# Patient Record
Sex: Male | Born: 1935 | Race: White | Hispanic: No | Marital: Married | Smoking: Never smoker
Health system: Southern US, Community
[De-identification: ages and names within clinical notes are randomized; demographics above are authoritative.]

## PROBLEM LIST (undated history)

## (undated) DIAGNOSIS — C801 Malignant (primary) neoplasm, unspecified: Secondary | ICD-10-CM

## (undated) DIAGNOSIS — G510 Bell's palsy: Secondary | ICD-10-CM

## (undated) DIAGNOSIS — D333 Benign neoplasm of cranial nerves: Secondary | ICD-10-CM

## (undated) DIAGNOSIS — R29810 Facial weakness: Secondary | ICD-10-CM

## (undated) DIAGNOSIS — D3610 Benign neoplasm of peripheral nerves and autonomic nervous system, unspecified: Secondary | ICD-10-CM

## (undated) DIAGNOSIS — R413 Other amnesia: Secondary | ICD-10-CM

## (undated) HISTORY — PX: BRAIN SURGERY: SHX531

---

## 2002-12-13 ENCOUNTER — Emergency Department (HOSPITAL_COMMUNITY): Admission: EM | Admit: 2002-12-13 | Discharge: 2002-12-13 | Payer: Self-pay | Admitting: *Deleted

## 2002-12-13 ENCOUNTER — Emergency Department (HOSPITAL_COMMUNITY): Admission: EM | Admit: 2002-12-13 | Discharge: 2002-12-13 | Payer: Self-pay | Admitting: Emergency Medicine

## 2003-08-18 ENCOUNTER — Emergency Department (HOSPITAL_COMMUNITY): Admission: EM | Admit: 2003-08-18 | Discharge: 2003-08-18 | Payer: Self-pay | Admitting: Emergency Medicine

## 2003-10-16 ENCOUNTER — Emergency Department (HOSPITAL_COMMUNITY): Admission: EM | Admit: 2003-10-16 | Discharge: 2003-10-16 | Payer: Self-pay | Admitting: Emergency Medicine

## 2004-03-29 ENCOUNTER — Emergency Department (HOSPITAL_COMMUNITY): Admission: EM | Admit: 2004-03-29 | Discharge: 2004-03-29 | Payer: Self-pay | Admitting: Emergency Medicine

## 2004-12-23 ENCOUNTER — Ambulatory Visit (HOSPITAL_COMMUNITY): Admission: RE | Admit: 2004-12-23 | Discharge: 2004-12-23 | Payer: Self-pay | Admitting: General Surgery

## 2005-01-06 ENCOUNTER — Encounter (INDEPENDENT_AMBULATORY_CARE_PROVIDER_SITE_OTHER): Payer: Self-pay | Admitting: General Surgery

## 2005-01-07 ENCOUNTER — Inpatient Hospital Stay (HOSPITAL_COMMUNITY): Admission: RE | Admit: 2005-01-07 | Discharge: 2005-01-09 | Payer: Self-pay | Admitting: General Surgery

## 2005-04-30 ENCOUNTER — Emergency Department (HOSPITAL_COMMUNITY): Admission: EM | Admit: 2005-04-30 | Discharge: 2005-04-30 | Payer: Self-pay | Admitting: Emergency Medicine

## 2006-01-04 ENCOUNTER — Ambulatory Visit (HOSPITAL_COMMUNITY): Admission: RE | Admit: 2006-01-04 | Discharge: 2006-01-04 | Payer: Self-pay | Admitting: Family Medicine

## 2006-01-26 ENCOUNTER — Ambulatory Visit (HOSPITAL_COMMUNITY): Admission: RE | Admit: 2006-01-26 | Discharge: 2006-01-26 | Payer: Self-pay | Admitting: Urology

## 2006-02-09 ENCOUNTER — Encounter (INDEPENDENT_AMBULATORY_CARE_PROVIDER_SITE_OTHER): Payer: Self-pay | Admitting: Specialist

## 2006-02-09 ENCOUNTER — Ambulatory Visit (HOSPITAL_COMMUNITY): Admission: RE | Admit: 2006-02-09 | Discharge: 2006-02-09 | Payer: Self-pay | Admitting: Urology

## 2009-02-14 ENCOUNTER — Emergency Department (HOSPITAL_COMMUNITY): Admission: EM | Admit: 2009-02-14 | Discharge: 2009-02-14 | Payer: Self-pay | Admitting: Emergency Medicine

## 2010-07-31 NOTE — Discharge Summary (Signed)
John [name], John [name]               ACCOUNT NO.:  [id number]   MEDICAL RECORD NO.:  [id number]          PATIENT TYPE:  INP   LOCATION:  A313                          FACILITY:  [facility]   PHYSICIAN:  Barbaraann Barthel, M.D. DATE OF BIRTH:  May 26, 1927   DATE OF ADMISSION:  01/06/2005  DATE OF DISCHARGE:  10/28/2006LH                                 DISCHARGE SUMMARY   DIAGNOSIS:  Acute cholecystitis secondary to cholelithiasis.   PROCEDURE:  On January 06, 2005 laparoscopic cholecystectomy.   SECONDARY DIAGNOSIS:  Seizure disorder of unknown cause.   HISTORY:  Note this is a 77 year old white male who had recurrent episodes  of right upper quadrant pain and occasional dyspepsia. He did not pay any  particular attention to this. He did however bring it to the attention of  his medical physician and sonogram was obtained and revealed cholelithiasis  and there did not appear to be any changes suggesting acute cholecystitis  however this was not verified intraoperatively. He indeed had a quite  thickened gallbladder and hydrops of the gallbladder necessitating his more  prolonged stay within the hospital.   He underwent surgery on January 06, 2005 and a laparoscopic cholecystectomy  was performed. He did well postoperatively, he did have some drainage from  his Jackson-Pratt drain, this was removed on the third postoperative day at  time of his discharge, his liver function studies and white count remained  within acceptable levels. Postoperatively the patient's Jackson-Pratt drain  diminished;  his pain was controlled with parenteral medicine during his  hospital stay adequately. He remained on antibiotics due to the findings of  acute cholecystitis. On the third postoperative day he was discharged. At  time of discharge he was tolerating p.o. well, he was voiding without  dysuria, his Jackson-Pratt drain had diminished and all of his wounds were  clean without any sign of infection. He was  not troubled with any seizure  problems. He will resume his aspirin which is the only medicine that he  apparently takes for his seizure and this is an unknown effect for which  showed Dr. Sandria Manly is following him. At any rate he did well postoperatively;  he was discharged on the third postoperative day.   LABORATORY DATA:  On January 07, 2005, his white count was 8.6 with a H&H of  13.1 and 37.7. His metabolic seven was grossly within normal limits. His  liver function studies also were grossly within normal limits. Total  bilirubin 1.7 with direct 0.2 and an indirect 1.5 (these showed a further  downward trend on the following day on January 08, 2005). His sonogram as  noted showed cholecystitis with cholelithiasis. His pathology report is not  on the chart at the time of this dictation.   DISCHARGE INSTRUCTIONS:  He is excused from work. He is discharged on a full  liquid and soft diet. He is permitted to increase his activity as tolerated.  He is permitted to shower. He is told not to go in a Jacuzzi or pool or tub  bath at present, no driving, no heavy lifting, no sexual  activity. His drain  site is to be changed as needed and this was explained to the patient. He is  to resume his medicines as per Dr. Sudie Bailey postoperatively. He is resume  his aspirin. He was further given a prescription for Darvocet-N 100 one  tablet every 4 hours as needed for pain. He is  told to contact me should there be any acute changes and I will follow him  perioperatively in my office and arrangements were made for this. He is told  to follow up further with Dr. Sudie Bailey for any medical problems he may have  in the future as well.      Barbaraann Barthel, M.D.  Electronically Signed     WB/MEDQ  D:  01/09/2005  T:  01/09/2005  Job:  [id number]   cc:   Mila Homer. Sudie Bailey, M.D.  Fax: [phone]   Genene Churn. Love, M.D.  Fax: [phone]

## 2010-07-31 NOTE — H&P (Signed)
[name], [name]               ACCOUNT NO.:  [id number]   MEDICAL RECORD NO.:  [id number]          PATIENT TYPE:  AMB   LOCATION:  DAY                           FACILITY:  [facility]   PHYSICIAN:  Dennie Maizes, M.D.   DATE OF BIRTH:  [birth year]   DATE OF ADMISSION:  02/09/2006  DATE OF DISCHARGE:  LH                              HISTORY & PHYSICAL   CHIEF COMPLAINT:  Intermittent gross hematuria, large bladder calculus.   HISTORY OF PRESENT ILLNESS:  This 77 year old male was referred to me by  Dr. Sudie Bailey with a past history of recurrent urolithiasis.  He has  passed several stones.  He has been having intermittent episodes of  gross hematuria since May of 2007.  He has had several episodes of  hematuria.  He did not have any voiding difficulty, dysuria, fever,  chills or frank pain.  He has urinary frequency q.2-3h. and nocturia x1.  His past x-rays have revealed  a large bladder calculus with an enlarged  prostate.   PAST MEDICAL HISTORY:  1. Status post excision of right acoustic neuroma in 1995.  2. Status post cholecystectomy.  3. History of recurrent urolithiasis.   MEDICATIONS:  Aspirin, which has been discontinued for the surgery.   ALLERGIES:  None.   FAMILY HISTORY:  Unremarkable.   PHYSICAL EXAMINATION:  HEENT:  The patient has right facial paralysis;  otherwise normal.  NECK:  No masses.  LUNGS:  Clear to auscultation.  HEART:  Regular rate and rhythm.  No murmurs.  ABDOMEN:  Soft, not palpable.  No frank mass, CVA tenderness or bladder  distention.  GU:  Penis and testes are normal.  RECTAL:  A 50 gm benign prostate.   IMPRESSION:  Hematuria, bladder calculus, benign prostatic hypertrophy  with bladder neck obstruction.   PLAN:  1. Cystoscopy, Holmium laser lithotripsy of the bladder calculus under      anesthesia in the short-stay center.  I have discussed with the      patient regarding the diagnosis, operative details, alternate      treatment,  possible risks and complications, and he has agreed for      the procedure to be done.      Dennie Maizes, M.D.  Electronically Signed     SK/MEDQ  D:  02/09/2006  T:  02/09/2006  Job:  [id number]   cc:   Mila Homer. Sudie Bailey, M.D.  Fax: [phone]

## 2010-07-31 NOTE — Op Note (Signed)
NAMECADELL, GABRIELSON               ACCOUNT NO.:  [id number]   MEDICAL RECORD NO.:  [id number]          PATIENT TYPE:  OBV   LOCATION:  A313                          FACILITY:  [facility]   PHYSICIAN:  Barbaraann Barthel, M.D. DATE OF BIRTH:  07-09-1927   DATE OF PROCEDURE:  DATE OF DISCHARGE:                                 OPERATIVE REPORT   SURGEON:  Barbaraann Barthel, M.D.   PRE-AND-POSTOPERATIVE DIAGNOSIS:  Acute cholecystitis.   PROCEDURE:  Laparoscopic cholecystectomy.   SPECIMEN:  Gallbladder with stones.   GROSS OPERATIVE FINDINGS:  The patient had considerable inflammation of the  gallbladder with adhesions and hydrops of the gallbladder. We aspirated  approximately 30 mL of clear bile from the gallbladder. There was a large  stone within the neck of the gallbladder.   The rest of the right upper quadrant appeared to be within normal limits.   NOTE:  This is a 77 year old white male who presented with history of right  upper quadrant pain, nausea, and vomiting. He essentially attributed this  pain to dyspepsia. He was taking a lot of Rolaids over the years. Sonogram  revealed a gallbladder with a stone in it; and did not reveal symptoms or  signs of acute cholecystitis; however, these we encountered  intraoperatively, i.e., thickening and edema and hydrops of the gallbladder.   TECHNIQUE:  The patient was placed in the supine position after the adequate  administration of general anesthesia via endotracheal intubation.  The  patient was prepped with another solution other than Betadine, as the  patient expressed an allergy to iodine.  A Foley catheter was then  aseptically inserted and incisions were made down to the superior aspect of  the umbilicus.  The fascia was grasped with a sharp towel clip, and  elevated.  A Veress needle was inserted and confirmed in position with a  saline drop test.  We then insufflated the abdomen of approximately 3.5  liters of CO2. Then using  the Vis-A-Port technique, an 11-mm cannula was  placed in the umbilicus incision; and then under direct vision three other  cannulas were placed; an 11-mm cannula in the epigastrium, and two 5-mm  cannulas in the right upper quadrant laterally.   The gallbladder was grasped, its adhesions were tediously taken down.  The  cystic duct was clearly visualized, triply, silver clipped on the side of  the common bile duct and then singly silver clipped on the side of the  gallbladder.  The cystic artery was likewise triply silver clipped and  divided.  The gallbladder was then removed from the liver bed. This was  somewhat tedious as the gallbladder was pretty intrahepatic.  . This was  done with the hook cautery device and then the gallbladder was removed using  the EndoCatch device from the epigastric incision.   We checked for hemostasis. The oozing on the gallbladder was cauterized with  a cautery device.  I elected to leave two pieces of Surgicel within the  liver bed; and place a Jackson-Pratt drain. It should be stated that prior  to removing the gallbladder, prior to  initiating, we had to remove  approximately 30 mL of fluid from the gallbladder by needle aspiration, but  this was uneventful. The drain was exited from one of the lateral 5-mm  cannula sites and sutured in place with a 3-0 Nylon suture. The fascia was  closed in the area of the umbilicus and epigastrium with #0 Polysorb.  All  incision sites were infiltrated with 1/2% Sensorcaine without epinephrine;  and the wounds were closed with a stapling device. Prior to closure all  sponge, needle, and instrument counts were found to be correct. Estimated  blood loss was minimal. The patient received 1400 mL of crystalloids  intraoperatively; and there were no complications.      Barbaraann Barthel, M.D.  Electronically Signed     WB/MEDQ  D:  01/06/2005  T:  01/06/2005  Job:  [id number]

## 2010-07-31 NOTE — Op Note (Signed)
NAMEMONTAVIOUS, John [name]               ACCOUNT NO.:  [id number]   MEDICAL RECORD NO.:  [id number]          PATIENT TYPE:  AMB   LOCATION:  DAY                           FACILITY:  [facility]   PHYSICIAN:  Dennie Maizes, M.D.   DATE OF BIRTH:  08-Jul-1927   DATE OF PROCEDURE:  02/09/2006  DATE OF DISCHARGE:                               OPERATIVE REPORT   PREOPERATIVE DIAGNOSIS:  Hematuria, large bladder calculus (1.5 cm).   POSTOPERATIVE DIAGNOSIS:  Hematuria, large bladder calculus (1.5 cm).   OPERATIVE PROCEDURE:  Cystoscopy, holmium laser lithotripsy of bladder  calculus (1.5 cm).   ANESTHESIA:  General.   SURGEON:  Dennie Maizes, M.D.   COMPLICATIONS:  None.   ESTIMATED BLOOD LOSS:  Minimal.   DRAINS:  18-French Foley catheter in the bladder.   INDICATIONS FOR PROCEDURE:  This 78 year old male has a past history of  recurrent urolithiasis.  He was evaluated for intermittent gross  hematuria and he was found to have a large bladder calculus measuring  1.5 cm in size.  The patient was taken to the operating room today for  cystoscopy and holmium laser lithotripsy of the bladder calculus.   DESCRIPTION OF PROCEDURE:  General anesthesia was induced and the  patient was placed on the OR table in the dorsal lithotomy position.  The lower abdomen and genitalia were prepped and draped in a sterile  fashion.  Cystoscopy was done with a 25-French scope.  The urethra was  normal.  There was moderate hypertrophy of the prostate with bladder  neck obstruction.  The bladder was then examined and found to be normal.  There was large calculus 1.5 cm in size inside the bladder lumen.   A 5 50 micron laser fiber was then inserted through the cystoscope.  The  stone was treated holmium laser and fragmented.  The laser settings  laser fiber 5 50 microns, laser energy 1.6 joules, rate pulse 20 total  KJ  3.95.   The stone was fragmented to smaller pieces by laser energy.  Irrigation  of bladder  was done with an Ellik evacuator and all the stone fragments  were removed from the bladder.  There was mild bleeding from the  prostate which resolved.  The instruments were then removed.  They a 38-  [place] Foley catheter was inserted into the bladder and connected to the  bag.  The patient was transferred to the PACU in a satisfactory  condition.      Dennie Maizes, M.D.  Electronically Signed     SK/MEDQ  D:  02/09/2006  T:  02/09/2006  Job:  [id number]   cc:   Mila Homer. Sudie Bailey, M.D.  Fax: [phone]

## 2011-02-17 ENCOUNTER — Observation Stay (HOSPITAL_COMMUNITY)
Admission: EM | Admit: 2011-02-17 | Discharge: 2011-02-18 | Disposition: A | Discharge status: Home / Self Care | Payer: Medicare Other | Attending: Family Medicine | Admitting: Family Medicine

## 2011-02-17 ENCOUNTER — Encounter: Payer: Self-pay | Admitting: *Deleted

## 2011-02-17 DIAGNOSIS — I951 Orthostatic hypotension: Secondary | ICD-10-CM | POA: Diagnosis present

## 2011-02-17 DIAGNOSIS — Z86018 Personal history of other benign neoplasm: Secondary | ICD-10-CM

## 2011-02-17 DIAGNOSIS — R29810 Facial weakness: Secondary | ICD-10-CM | POA: Diagnosis present

## 2011-02-17 DIAGNOSIS — R011 Cardiac murmur, unspecified: Secondary | ICD-10-CM | POA: Insufficient documentation

## 2011-02-17 DIAGNOSIS — E860 Dehydration: Secondary | ICD-10-CM | POA: Diagnosis present

## 2011-02-17 DIAGNOSIS — Z79899 Other long term (current) drug therapy: Secondary | ICD-10-CM | POA: Insufficient documentation

## 2011-02-17 DIAGNOSIS — R55 Syncope and collapse: Principal | ICD-10-CM | POA: Insufficient documentation

## 2011-02-17 DIAGNOSIS — I69992 Facial weakness following unspecified cerebrovascular disease: Secondary | ICD-10-CM | POA: Insufficient documentation

## 2011-02-17 DIAGNOSIS — R911 Solitary pulmonary nodule: Secondary | ICD-10-CM | POA: Diagnosis present

## 2011-02-17 DIAGNOSIS — R42 Dizziness and giddiness: Secondary | ICD-10-CM | POA: Diagnosis present

## 2011-02-17 HISTORY — DX: Bell's palsy: G51.0

## 2011-02-17 HISTORY — DX: Benign neoplasm of peripheral nerves and autonomic nervous system, unspecified: D36.10

## 2011-02-17 MED ORDER — ONDANSETRON HCL 4 MG/2ML IJ SOLN
4.0000 mg | Freq: Once | INTRAMUSCULAR | Status: AC
  Administered 2011-02-18: 4 mg via INTRAVENOUS
  Filled 2011-02-17: qty 2

## 2011-02-17 MED ORDER — SODIUM CHLORIDE 0.9 % IV SOLN
INTRAVENOUS | Status: DC
  Administered 2011-02-18: via INTRAVENOUS

## 2011-02-17 NOTE — ED Notes (Signed)
Pt reports nausea and weakness began about 9 pm.  Denies any vomiting at this time.  Reports mild temperature.

## 2011-02-18 ENCOUNTER — Emergency Department (HOSPITAL_COMMUNITY): Payer: Medicare Other

## 2011-02-18 ENCOUNTER — Other Ambulatory Visit: Payer: Self-pay

## 2011-02-18 ENCOUNTER — Encounter (HOSPITAL_COMMUNITY): Payer: Self-pay | Admitting: *Deleted

## 2011-02-18 ENCOUNTER — Inpatient Hospital Stay (HOSPITAL_COMMUNITY): Payer: Medicare Other

## 2011-02-18 DIAGNOSIS — R42 Dizziness and giddiness: Secondary | ICD-10-CM | POA: Diagnosis present

## 2011-02-18 DIAGNOSIS — R29810 Facial weakness: Secondary | ICD-10-CM | POA: Diagnosis present

## 2011-02-18 DIAGNOSIS — Z86018 Personal history of other benign neoplasm: Secondary | ICD-10-CM

## 2011-02-18 DIAGNOSIS — I359 Nonrheumatic aortic valve disorder, unspecified: Secondary | ICD-10-CM

## 2011-02-18 DIAGNOSIS — E860 Dehydration: Secondary | ICD-10-CM | POA: Diagnosis present

## 2011-02-18 DIAGNOSIS — R911 Solitary pulmonary nodule: Secondary | ICD-10-CM | POA: Diagnosis present

## 2011-02-18 DIAGNOSIS — I951 Orthostatic hypotension: Secondary | ICD-10-CM | POA: Diagnosis present

## 2011-02-18 LAB — COMPREHENSIVE METABOLIC PANEL
ALT: 13 U/L (ref 0–53)
AST: 16 U/L (ref 0–37)
Albumin: 3.7 g/dL (ref 3.5–5.2)
Alkaline Phosphatase: 59 U/L (ref 39–117)
BUN: 14 mg/dL (ref 6–23)
CO2: 26 mEq/L (ref 19–32)
Calcium: 9.9 mg/dL (ref 8.4–10.5)
Chloride: 103 mEq/L (ref 96–112)
Creatinine, Ser: 1.26 mg/dL (ref 0.50–1.35)
GFR calc Af Amer: 60 mL/min — ABNORMAL LOW (ref >90)
GFR calc non Af Amer: 52 mL/min — ABNORMAL LOW (ref >90)
Glucose, Bld: 150 mg/dL — ABNORMAL HIGH (ref 70–99)
Potassium: 3.6 mEq/L (ref 3.5–5.1)
Sodium: 137 mEq/L (ref 135–145)
Total Bilirubin: 2.9 mg/dL — ABNORMAL HIGH (ref 0.3–1.2)
Total Protein: 6.6 g/dL (ref 6.0–8.3)

## 2011-02-18 LAB — URINALYSIS, ROUTINE W REFLEX MICROSCOPIC
Glucose, UA: NEGATIVE mg/dL
Ketones, ur: 40 mg/dL — AB
Leukocytes, UA: NEGATIVE
Nitrite: NEGATIVE
Specific Gravity, Urine: 1.01 (ref 1.005–1.030)
Urobilinogen, UA: 1 mg/dL (ref 0.0–1.0)
pH: 6.5 (ref 5.0–8.0)

## 2011-02-18 LAB — CBC
HCT: 42.3 % (ref 39.0–52.0)
HCT: 42.4 % (ref 39.0–52.0)
Hemoglobin: 14.7 g/dL (ref 13.0–17.0)
MCH: 32.1 pg (ref 26.0–34.0)
MCH: 32.5 pg (ref 26.0–34.0)
MCHC: 34.7 g/dL (ref 30.0–36.0)
MCV: 92.6 fL (ref 78.0–100.0)
MCV: 93.6 fL (ref 78.0–100.0)
Platelets: 115 10*3/uL — ABNORMAL LOW (ref 150–400)
Platelets: 137 10*3/uL — ABNORMAL LOW (ref 150–400)
RBC: 4.52 MIL/uL (ref 4.22–5.81)
RBC: 4.58 MIL/uL (ref 4.22–5.81)
RDW: 12.5 % (ref 11.5–15.5)
RDW: 12.8 % (ref 11.5–15.5)
WBC: 11.4 10*3/uL — ABNORMAL HIGH (ref 4.0–10.5)

## 2011-02-18 LAB — URINE MICROSCOPIC-ADD ON

## 2011-02-18 LAB — DIFFERENTIAL
Basophils Absolute: 0 10*3/uL (ref 0.0–0.1)
Basophils Relative: 0 % (ref 0–1)
Eosinophils Absolute: 0 10*3/uL (ref 0.0–0.7)
Eosinophils Relative: 0 % (ref 0–5)
Lymphocytes Relative: 6 % — ABNORMAL LOW (ref 12–46)
Lymphs Abs: 0.7 10*3/uL (ref 0.7–4.0)
Monocytes Absolute: 0.6 10*3/uL (ref 0.1–1.0)
Monocytes Relative: 6 % (ref 3–12)
Neutro Abs: 10 10*3/uL — ABNORMAL HIGH (ref 1.7–7.7)
Neutrophils Relative %: 88 % — ABNORMAL HIGH (ref 43–77)

## 2011-02-18 LAB — BASIC METABOLIC PANEL
CO2: 28 mEq/L (ref 19–32)
Calcium: 9.6 mg/dL (ref 8.4–10.5)
Chloride: 102 mEq/L (ref 96–112)
Creatinine, Ser: 1.2 mg/dL (ref 0.50–1.35)
Glucose, Bld: 139 mg/dL — ABNORMAL HIGH (ref 70–99)

## 2011-02-18 LAB — CARDIAC PANEL(CRET KIN+CKTOT+MB+TROPI)
CK, MB: 3.3 ng/mL (ref 0.3–4.0)
Relative Index: 3.1 — ABNORMAL HIGH (ref 0.0–2.5)
Total CK: 108 U/L (ref 7–232)
Total CK: 104 U/L (ref 7–232)
Troponin I: <0.3 ng/mL (ref <0.30)

## 2011-02-18 LAB — LIPASE, BLOOD: Lipase: 25 U/L (ref 11–59)

## 2011-02-18 MED ORDER — SODIUM CHLORIDE 0.9 % IV SOLN
INTRAVENOUS | Status: DC
  Administered 2011-02-18: 04:00:00 via INTRAVENOUS

## 2011-02-18 MED ORDER — IOHEXOL 300 MG/ML  SOLN
80.0000 mL | Freq: Once | INTRAMUSCULAR | Status: AC | PRN
  Administered 2011-02-18: 80 mL via INTRAVENOUS

## 2011-02-18 MED ORDER — ASPIRIN 325 MG PO TABS
325.0000 mg | ORAL_TABLET | Freq: Every day | ORAL | Status: DC
  Administered 2011-02-18: 325 mg via ORAL
  Filled 2011-02-18: qty 1

## 2011-02-18 MED ORDER — SODIUM CHLORIDE 0.9 % IV BOLUS (SEPSIS): 1000.0000 mL | Freq: Once | INTRAVENOUS | Status: DC

## 2011-02-18 MED ORDER — ENOXAPARIN SODIUM 40 MG/0.4ML ~~LOC~~ SOLN
40.0000 mg | SUBCUTANEOUS | Status: DC
  Administered 2011-02-18: 40 mg via SUBCUTANEOUS
  Filled 2011-02-18: qty 0.4

## 2011-02-18 NOTE — ED Notes (Signed)
Hospitalist at bedside 
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
## 2011-02-18 NOTE — Discharge Summary (Signed)
NAMETREVELLE, MCGURN               ACCOUNT NO.:  [id number]  MEDICAL RECORD NO.:  [id number]  LOCATION:  A338                          FACILITY:  [facility]  PHYSICIAN:  Camp Gopal. Sudie Bailey, M.D.DATE OF BIRTH:  [birth year]  DATE OF ADMISSION:  02/17/2011 DATE OF DISCHARGE:  LH                              DISCHARGE SUMMARY   ADDENDUM: He has had a cough recently which he usually does not have.  He has had no fever or chills, and he is not coughing up any sputum, but he has been extremely fatigued.  I talked to the patient and his wife.  I am starting him on Levaquin 500 mg daily which he already has at home, and once he is out of those 3 or 4 pills to take another 10-day course which is written by prescription today for a total of 14 days.  We will recheck a chest x-ray in 2 weeks, and I will see him in follow up shortly after that.  If he does indeed need a biopsy, we should know by then.     Mila Homer. Sudie Bailey, M.D.     SDK/MEDQ  D:  02/18/2011  T:  02/18/2011  Job:  [id number]

## 2011-02-18 NOTE — Progress Notes (Signed)
*  PRELIMINARY RESULTS* Echocardiogram 2D Echocardiogram has been performed.  John [name] 02/18/2011, 12:37 PM

## 2011-02-18 NOTE — H&P (Signed)
PCP:   Dr. Sudie Bailey   Chief Complaint:  Dizziness  HPI: 77 year old male with a history of acoustic neuroma resection on the right in the 1980s with residual right-sided facial droop who comes in with a brief episode of dizziness that occurred while he was eating out at a restaurant. The episode of brief was brief and quickly resolved after sitting down. 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 He denies any recent nausea vomiting diarrhea fevers or chills. He has been in his normal state of health. On arrival in the emergency department he was found to be orthostatic. He denies having any chest pain headache abdominal pain or palpitations.  Review of Systems:  Otherwise negative  Past Medical History: Past Medical History  Diagnosis Date  . Bell's palsy   . Neuroma    Past Surgical History  Procedure Date  . Brain surgery     Medications: Prior to Admission medications   Medication Sig Start Date End Date Taking? Authorizing Provider  aspirin 325 MG tablet Take 325 mg by mouth daily.     Yes Historical Provider, MD    Allergies:  No Known Allergies  Social History:  reports that he has never smoked. He does not have any smokeless tobacco history on file. He reports that he does not drink alcohol. His drug history not on file.  Family History: History reviewed. No pertinent family history.  Physical Exam: Filed Vitals:   02/17/11 2254 02/18/11 0018 02/18/11 0022 02/18/11 0024  BP: 108/71 101/59 97/61 88/55   Pulse: 81 91 91 89  Temp: 98.3 F (36.8 C)     Resp: 16     Height: 6\' 11"  (2.108 m)     Weight: 54.432 kg (120 lb)     SpO2: 93%      General appearance: alert, cooperative and no distress Resp: clear to auscultation bilaterally Cardio: regular rate and rhythm, S1, S2 normal, grade 3 of 6 systolic ejection murmur, click, rub or gallop GI:  soft, non-tender; bowel sounds normal; no masses,  no organomegaly Extremities: extremities normal, atraumatic, no cyanosis or edema Pulses: 2+ and symmetric Skin: Skin color, texture, turgor normal. No rashes or lesions Neurologic: normal except right facial droop signicant and chronic                                                          Labs on Admission:   [facility] [place] 02/17/11 2356  NA 137  K 3.6  CL 103  CO2 26  GLUCOSE 150*  BUN 14  CREATININE 1.26  CALCIUM 9.9  MG --  PHOS --    Basename 02/17/11 2356  AST 16  ALT 13  ALKPHOS 59  BILITOT 2.9*  PROT 6.6  ALBUMIN 3.7    Basename 02/17/11 2356  LIPASE 25  AMYLASE --    Basename 02/17/11 2356  WBC 11.4*  NEUTROABS 10.0*  HGB 14.7  HCT 42.4  MCV 92.6  PLT 115*    Basename 02/17/11 2356  CKTOTAL 108  CKMB 3.3  CKMBINDEX --  TROPONINI <0.30    Radiological Exams on Admission: Dg Chest 2 View  02/18/2011  *RADIOLOGY REPORT*  Clinical  Data: Syncopal episode.  CHEST - 2 VIEW  Comparison: Chest radiographs 10/16/2003.  Findings: The patient is rotated on the lateral view.  Heart size and mediastinal contours are stable. There is a small hiatal hernia.  There is a developing irregular nodular density at the confluence of the left third and sixth ribs on the frontal examination.  This is not well seen on the lateral view but does not appear osseous.  The lungs are otherwise clear.  There is no pleural effusion.  Thoracic spine osteophytes are noted.  IMPRESSION:  1.  Suspicion of irregular left upper lobe pulmonary nodule concerning for bronchogenic carcinoma. 2.  No acute cardiopulmonary process.  Chest CT is recommended for further evaluation.  Original Report Authenticated By: Gerrianne Scale, M.D.   Ct Head Wo Contrast  02/18/2011  *RADIOLOGY REPORT*  Clinical Data: Status post fall; syncope.  History of removal of right-sided acoustic neuroma.  CT HEAD WITHOUT CONTRAST  Technique:   Contiguous axial images were obtained from the base of the skull through the vertex without contrast.  Comparison: None.  Findings: There is no evidence of acute infarction, mass lesion, or intra- or extra-axial hemorrhage on CT.  Prominence of the ventricles and sulci reflects mild cortical volume loss.  Cerebellar atrophy is noted.  Chronic encephalomalacia is noted at the right cerebellar hemisphere, reflecting prior surgery.  The brainstem and fourth ventricle are within normal limits.  The basal ganglia are unremarkable in appearance.  The cerebral hemispheres demonstrate grossly normal gray-white differentiation. No mass effect or midline shift is seen.  There is no evidence of fracture; the patient's right occiput craniectomy and right temporal craniotomy are grossly unremarkable in appearance.  The orbits are within normal limits.  The paranasal sinuses and mastoid air cells are well-aerated. There is mild sclerosis of the left mastoid process.  No significant soft tissue abnormalities are seen.  IMPRESSION:  1.  No acute intracranial pathology seen on CT. 2.  Mild cortical volume loss. 3.  Chronic encephalomalacia at the right cerebellar hemisphere, reflecting prior surgery.  Original Report Authenticated By: Tonia Ghent, M.D.    Assessment/Plan Present on Admission:  77 year old male with presyncope and orthostatic hypotension  .Orthostatic hypotension provide some IV fluids overnight he is dehydrated we'll repeat orthostatics in the morning.  .Dizziness/presyncope we'll obtain 2-D echo due to his mild murmur on exam and will not do any further neurological workup due to lack of neurological symptoms at this time.  .Facial droop chronic right stable  .Dehydration .Pulmonary nodule going to obtain a CT of his chest in the morning to better distinguish this nodule.  Patient is Dr. Kenn File patient I will have him as the attending on file.  Carriann Hesse A 02/18/2011, 2:35 AM

## 2011-02-18 NOTE — ED Provider Notes (Signed)
History     CSN: [id number] Arrival date & time: 02/17/2011 10:50 PM   First MD Initiated Contact with Patient 02/17/11 2321      Chief Complaint  Patient presents with  . Nausea  . Emesis    (Consider location/radiation/quality/duration/timing/severity/associated sxs/prior treatment) HPI Comments: Patient presents with generalized weakness and nausea that began acutely around 9 PM. He began to feel weak and dizzy in the car until he is going to throw up. He needed help to get out of the car and felt like he was going to pass out but did not. This is shortly after going out for dinner. He did not hit his head or lose consciousness. He denies any chest pain, shortness of breath, and vomiting. Denies abdominal pain. States he felt generally weak and lightheaded for most of the day. Denies any fever, pain with urination, trouble moving his bowels. He's had problems with dizziness in the past that he has been told is due to global amnesia.  The history is provided by the patient and the spouse.    Past Medical History  Diagnosis Date  . Bell's palsy   . Neuroma     Past Surgical History  Procedure Date  . Brain surgery     History reviewed. No pertinent family history.  History  Substance Use Topics  . Smoking status: Never Smoker   . Smokeless tobacco: Not on file  . Alcohol Use: No      Review of Systems  Constitutional: Positive for activity change, appetite change and fatigue. Negative for fever.  HENT: Negative for congestion and rhinorrhea.   Respiratory: Negative for cough, chest tightness and shortness of breath.   Cardiovascular: Negative for chest pain.  Gastrointestinal: Positive for nausea and vomiting. Negative for abdominal pain.  Genitourinary: Negative for dysuria and hematuria.  Musculoskeletal: Positive for myalgias and arthralgias. Negative for back pain.  Neurological: Positive for dizziness, weakness and light-headedness. Negative for headaches.     Allergies  Review of patient's allergies indicates no known allergies.  Home Medications   Current Outpatient Rx  Name Route Sig Dispense Refill  . ASPIRIN 325 MG PO TABS Oral Take 325 mg by mouth daily.        BP 88/55  Pulse 89  Temp 98.3 F (36.8 C)  Resp 16  Ht 6\' 11"  (2.108 m)  Wt 120 lb (54.432 kg)  BMI 12.25 kg/m2  SpO2 93%  Physical Exam  Constitutional: He is oriented to person, place, and time. He appears well-developed and well-nourished. No distress.  HENT:  Head: Normocephalic and atraumatic.  Mouth/Throat: Oropharynx is clear and moist. No oropharyngeal exudate.  Eyes:       Chronic right eye changes  Neck: Normal range of motion. Neck supple.  Cardiovascular: Normal rate and regular rhythm.   Murmur heard.      3-6 systolic murmur at the base of the heart  Pulmonary/Chest: Effort normal and breath sounds normal. No respiratory distress.  Abdominal: Soft. There is no tenderness. There is no rebound and no guarding.  Musculoskeletal: Normal range of motion. He exhibits no edema and no tenderness.  Neurological: He is alert and oriented to person, place, and time. No cranial nerve deficit.       Chronic right facial droop  Skin: Skin is warm.    ED Course  Procedures (including critical care time)  Labs Reviewed  CBC - Abnormal; Notable for the following:    WBC 11.4 (*)    Platelets  115 (*)    All other components within normal limits  DIFFERENTIAL - Abnormal; Notable for the following:    Neutrophils Relative 88 (*)    Neutro Abs 10.0 (*)    Lymphocytes Relative 6 (*)    All other components within normal limits  COMPREHENSIVE METABOLIC PANEL - Abnormal; Notable for the following:    Glucose, Bld 150 (*)    Total Bilirubin 2.9 (*)    GFR calc non Af Amer 52 (*)    GFR calc Af Amer 60 (*)    All other components within normal limits  CARDIAC PANEL(CRET KIN+CKTOT+MB+TROPI) - Abnormal; Notable for the following:    Relative Index 3.1 (*)     All other components within normal limits  URINALYSIS, ROUTINE W REFLEX MICROSCOPIC - Abnormal; Notable for the following:    Color, Urine AMBER (*) BIOCHEMICALS MAY BE AFFECTED BY COLOR   Hgb urine dipstick SMALL (*)    Bilirubin Urine SMALL (*)    Ketones, ur 40 (*)    Protein, ur TRACE (*)    All other components within normal limits  URINE MICROSCOPIC-ADD ON - Abnormal; Notable for the following:    Bacteria, UA FEW (*)    All other components within normal limits  LIPASE, BLOOD   Dg Chest 2 View  02/18/2011  *RADIOLOGY REPORT*  Clinical Data: Syncopal episode.  CHEST - 2 VIEW  Comparison: Chest radiographs 10/16/2003.  Findings: The patient is rotated on the lateral view.  Heart size and mediastinal contours are stable. There is a small hiatal hernia.  There is a developing irregular nodular density at the confluence of the left third and sixth ribs on the frontal examination.  This is not well seen on the lateral view but does not appear osseous.  The lungs are otherwise clear.  There is no pleural effusion.  Thoracic spine osteophytes are noted.  IMPRESSION:  1.  Suspicion of irregular left upper lobe pulmonary nodule concerning for bronchogenic carcinoma. 2.  No acute cardiopulmonary process.  Chest CT is recommended for further evaluation.  Original Report Authenticated By: Gerrianne Scale, M.D.   Ct Head Wo Contrast  02/18/2011  *RADIOLOGY REPORT*  Clinical Data: Status post fall; syncope.  History of removal of right-sided acoustic neuroma.  CT HEAD WITHOUT CONTRAST  Technique:  Contiguous axial images were obtained from the base of the skull through the vertex without contrast.  Comparison: None.  Findings: There is no evidence of acute infarction, mass lesion, or intra- or extra-axial hemorrhage on CT.  Prominence of the ventricles and sulci reflects mild cortical volume loss.  Cerebellar atrophy is noted.  Chronic encephalomalacia is noted at the right cerebellar hemisphere,  reflecting prior surgery.  The brainstem and fourth ventricle are within normal limits.  The basal ganglia are unremarkable in appearance.  The cerebral hemispheres demonstrate grossly normal gray-white differentiation. No mass effect or midline shift is seen.  There is no evidence of fracture; the patient's right occiput craniectomy and right temporal craniotomy are grossly unremarkable in appearance.  The orbits are within normal limits.  The paranasal sinuses and mastoid air cells are well-aerated. There is mild sclerosis of the left mastoid process.  No significant soft tissue abnormalities are seen.  IMPRESSION:  1.  No acute intracranial pathology seen on CT. 2.  Mild cortical volume loss. 3.  Chronic encephalomalacia at the right cerebellar hemisphere, reflecting prior surgery.  Original Report Authenticated By: Tonia Ghent, M.D.     1. Near  syncope   2. Lung nodule   3. Orthostatic hypotension       MDM  Weakness, nausea, lightheadedness system with near syncope. Patient feels better at this time is hemodynamically stable. We'll check orthostatic vitals, chest x-ray given his hypoxia and head CT.  Near syncope with orthostatic hypotension and previously undocumented heart murmur. Patient would benefit from admission for IV hydration, echocardiogram and workup of his lung nodule.   Date: 02/18/2011  Rate: 81  Rhythm: normal sinus rhythm  QRS Axis: normal  Intervals: normal  ST/T Wave abnormalities: normal  Conduction Disutrbances:none  Narrative Interpretation:   Old EKG Reviewed: none available          Glynn Octave, MD 02/18/11 [phone]

## 2011-02-18 NOTE — Progress Notes (Signed)
D/c instructions reviewed with patient and wife.  Verbalized understanding.  Pt dc'd to home with wife.  Schonewitz, Candelaria Stagers 02/18/2011

## 2011-02-18 NOTE — Progress Notes (Signed)
UR Chart Review Completed  

## 2012-06-10 ENCOUNTER — Encounter (HOSPITAL_COMMUNITY): Payer: Self-pay | Admitting: *Deleted

## 2012-06-10 ENCOUNTER — Emergency Department (HOSPITAL_COMMUNITY)
Admission: EM | Admit: 2012-06-10 | Discharge: 2012-06-10 | Disposition: A | Discharge status: Home / Self Care | Payer: PRIVATE HEALTH INSURANCE | Attending: Emergency Medicine | Admitting: Emergency Medicine

## 2012-06-10 DIAGNOSIS — R112 Nausea with vomiting, unspecified: Secondary | ICD-10-CM | POA: Insufficient documentation

## 2012-06-10 DIAGNOSIS — N390 Urinary tract infection, site not specified: Secondary | ICD-10-CM | POA: Insufficient documentation

## 2012-06-10 DIAGNOSIS — G510 Bell's palsy: Secondary | ICD-10-CM | POA: Insufficient documentation

## 2012-06-10 DIAGNOSIS — Z8669 Personal history of other diseases of the nervous system and sense organs: Secondary | ICD-10-CM | POA: Insufficient documentation

## 2012-06-10 DIAGNOSIS — Z7982 Long term (current) use of aspirin: Secondary | ICD-10-CM | POA: Insufficient documentation

## 2012-06-10 LAB — CBC WITH DIFFERENTIAL/PLATELET
Basophils Absolute: 0 K/uL (ref 0.0–0.1)
Basophils Relative: 0 % (ref 0–1)
Eosinophils Absolute: 0 K/uL (ref 0.0–0.7)
Eosinophils Relative: 0 % (ref 0–5)
HCT: 46.2 % (ref 39.0–52.0)
Hemoglobin: 16.2 g/dL (ref 13.0–17.0)
Lymphocytes Relative: 9 % — ABNORMAL LOW (ref 12–46)
Lymphs Abs: 0.6 K/uL — ABNORMAL LOW (ref 0.7–4.0)
MCH: 31.8 pg (ref 26.0–34.0)
MCHC: 35.1 g/dL (ref 30.0–36.0)
MCV: 90.8 fL (ref 78.0–100.0)
Monocytes Absolute: 0.2 K/uL (ref 0.1–1.0)
Monocytes Relative: 3 % (ref 3–12)
Neutro Abs: 5.7 K/uL (ref 1.7–7.7)
Neutrophils Relative %: 88 % — ABNORMAL HIGH (ref 43–77)
Platelets: 124 K/uL — ABNORMAL LOW (ref 150–400)
RBC: 5.09 MIL/uL (ref 4.22–5.81)
RDW: 12.9 % (ref 11.5–15.5)
WBC: 6.5 K/uL (ref 4.0–10.5)

## 2012-06-10 LAB — URINALYSIS, ROUTINE W REFLEX MICROSCOPIC
Bilirubin Urine: NEGATIVE
Glucose, UA: NEGATIVE mg/dL
Ketones, ur: 15 mg/dL — AB
Leukocytes, UA: NEGATIVE
Nitrite: NEGATIVE
Protein, ur: NEGATIVE mg/dL
Specific Gravity, Urine: 1.02 (ref 1.005–1.030)
Urobilinogen, UA: 1 mg/dL (ref 0.0–1.0)
pH: 8.5 — ABNORMAL HIGH (ref 5.0–8.0)

## 2012-06-10 LAB — BASIC METABOLIC PANEL WITH GFR
BUN: 18 mg/dL (ref 6–23)
CO2: 23 meq/L (ref 19–32)
Calcium: 10.3 mg/dL (ref 8.4–10.5)
Chloride: 99 meq/L (ref 96–112)
Creatinine, Ser: 1.04 mg/dL (ref 0.50–1.35)
GFR calc Af Amer: 75 mL/min — ABNORMAL LOW
GFR calc non Af Amer: 64 mL/min — ABNORMAL LOW
Glucose, Bld: 139 mg/dL — ABNORMAL HIGH (ref 70–99)
Potassium: 4.5 meq/L (ref 3.5–5.1)
Sodium: 138 meq/L (ref 135–145)

## 2012-06-10 LAB — URINE MICROSCOPIC-ADD ON

## 2012-06-10 MED ORDER — ONDANSETRON HCL 4 MG/2ML IJ SOLN
4.0000 mg | Freq: Once | INTRAMUSCULAR | Status: AC
  Administered 2012-06-10: 4 mg via INTRAVENOUS
  Filled 2012-06-10: qty 2

## 2012-06-10 MED ORDER — ONDANSETRON HCL 8 MG PO TABS: 8.0000 mg | ORAL_TABLET | Freq: Three times a day (TID) | ORAL | Status: DC | PRN

## 2012-06-10 MED ORDER — CEPHALEXIN 500 MG PO CAPS: 500.0000 mg | ORAL_CAPSULE | Freq: Three times a day (TID) | ORAL | Status: DC

## 2012-06-10 MED ORDER — DEXTROSE 5 % IV SOLN
1.0000 g | INTRAVENOUS | Status: DC
  Administered 2012-06-10: 1 g via INTRAVENOUS
  Filled 2012-06-10: qty 10

## 2012-06-10 NOTE — ED Notes (Signed)
Pt presents to er with c/o n/v, pt vomiting in triage, states that he started feeling nauseous this am when he got up, felt like he was going to pass out, denies any loc, denies any pain.

## 2012-06-10 NOTE — ED Provider Notes (Signed)
History  This chart was scribed for John Roots, MD by Erskine Emery, ED Scribe. This patient was seen in room [facility] and the patient's care was started at 13:56.   CSN: [id number]  Arrival date & time 06/10/12  1248   First MD Initiated Contact with Patient 06/10/12 1356     Level 5 Caveat--Mental Status Change  Chief Complaint  Patient presents with  . Nausea  . Emesis    (Consider location/radiation/quality/duration/timing/severity/associated sxs/prior Treatment) Level 5 Caveat--Mental Status Change The history is provided by the patient. No language interpreter was used.  John [name] is a 79 y.o. male who presents to the Emergency Department complaining of sudden onset nausea and emesis. Pt picked up wife after her pedicure appt, and then c/o stomach upset, nausea, felt as if was going to vomit.  On arrival to ed single episode emesis, not bloody or bilious. . Pt reports felt faint w nv, but no syncope/loc.  Pt's wife reports he has hx  transient global amnesia attacks, which have been worked up extensively in past. He denies any pains at this time or any bowel movement today but he doesn't think he is abnormally constipated. Pt has a h/o Bell's palsy, acuostic neuroma, and brain surgery in 1989. Pt's spouse denies being sick herself. No known ill contacts. 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 Denies eye pain or change in vision.      Past Medical History  Diagnosis Date  . Bell's palsy   . Neuroma     Past Surgical History  Procedure Laterality Date  . Brain surgery      No family history on file.  History  Substance Use Topics  . Smoking status: Never Smoker   . Smokeless tobacco: Never Used  . Alcohol Use: No      Review of Systems  Constitutional: Negative for fever.  HENT: Negative for neck pain.   Eyes: Negative for visual disturbance.  Respiratory: Negative for cough.   Cardiovascular: Negative for  chest pain.  Gastrointestinal: Negative for abdominal pain.  Genitourinary: Negative for flank pain.  Musculoskeletal: Negative for back pain.  Skin: Negative for rash.  Neurological: Negative for headaches.  Hematological: Does not bruise/bleed easily.  Psychiatric/Behavioral: The patient is not nervous/anxious.   ?relability ros, spouse noting hx tga  Allergies  Fish allergy  Home Medications   Current Outpatient Rx  Name  Route  Sig  Dispense  Refill  . aspirin 325 MG tablet   Oral   Take 325 mg by mouth daily.             Triage Vitals: BP 137/93  Pulse 64  Temp(Src) 97.3 F (36.3 C) (Oral)  Resp 22  Ht 5\' 9"  (1.753 m)  Wt 150 lb (68.04 kg)  BMI 22.14 kg/m2  SpO2 100%  Physical Exam  Nursing note and vitals reviewed. Constitutional: He is oriented to person, place, and time. He appears well-developed and well-nourished. No distress.  HENT:  Head: Normocephalic and atraumatic.  Mouth/Throat: Oropharynx is clear and moist.  Eyes: Conjunctivae and EOM are normal. Pupils are equal, round, and reactive to light.  Neck: Neck supple. No tracheal deviation present.  Cardiovascular: Normal rate, regular rhythm, normal heart sounds and intact distal pulses.   Pulmonary/Chest: Effort normal and breath sounds normal. No respiratory distress.  Abdominal: Soft. Bowel sounds are normal. He exhibits no distension and no mass. There is no tenderness. There is no  rebound and no guarding.  Musculoskeletal: Normal range of motion. He exhibits no edema.  Neurological: He is alert and oriented to person, place, and time.  Skin: Skin is warm and dry. He is not diaphoretic.  Psychiatric: He has a normal mood and affect.    ED Course  Procedures (including critical care time) DIAGNOSTIC STUDIES: Oxygen Saturation is 100% on room air, normal by my interpretation.    COORDINATION OF CARE: 14:23--I evaluated the patient and we discussed a treatment plan including Zofran, and blood  work to which the pt and his wife agreed.   Results for orders placed during the hospital encounter of 06/10/12  CBC WITH DIFFERENTIAL      Result Value Range   WBC 6.5  4.0 - 10.5 K/uL   RBC 5.09  4.22 - 5.81 MIL/uL   Hemoglobin 16.2  13.0 - 17.0 g/dL   HCT 16.1  09.6 - 04.5 %   MCV 90.8  78.0 - 100.0 fL   MCH 31.8  26.0 - 34.0 pg   MCHC 35.1  30.0 - 36.0 g/dL   RDW 40.9  81.1 - 91.4 %   Platelets 124 (*) 150 - 400 K/uL   Neutrophils Relative 88 (*) 43 - 77 %   Neutro Abs 5.7  1.7 - 7.7 K/uL   Lymphocytes Relative 9 (*) 12 - 46 %   Lymphs Abs 0.6 (*) 0.7 - 4.0 K/uL   Monocytes Relative 3  3 - 12 %   Monocytes Absolute 0.2  0.1 - 1.0 K/uL   Eosinophils Relative 0  0 - 5 %   Eosinophils Absolute 0.0  0.0 - 0.7 K/uL   Basophils Relative 0  0 - 1 %   Basophils Absolute 0.0  0.0 - 0.1 K/uL  BASIC METABOLIC PANEL      Result Value Range   Sodium 138  135 - 145 mEq/L   Potassium 4.5  3.5 - 5.1 mEq/L   Chloride 99  96 - 112 mEq/L   CO2 23  19 - 32 mEq/L   Glucose, Bld 139 (*) 70 - 99 mg/dL   BUN 18  6 - 23 mg/dL   Creatinine, Ser 7.82  0.50 - 1.35 mg/dL   Calcium 95.6  8.4 - 21.3 mg/dL   GFR calc non Af Amer 64 (*) >90 mL/min   GFR calc Af Amer 75 (*) >90 mL/min  URINALYSIS, ROUTINE W REFLEX MICROSCOPIC      Result Value Range   Color, Urine YELLOW  YELLOW   APPearance CLOUDY (*) CLEAR   Specific Gravity, Urine 1.020  1.005 - 1.030   pH 8.5 (*) 5.0 - 8.0   Glucose, UA NEGATIVE  NEGATIVE mg/dL   Hgb urine dipstick TRACE (*) NEGATIVE   Bilirubin Urine NEGATIVE  NEGATIVE   Ketones, ur 15 (*) NEGATIVE mg/dL   Protein, ur NEGATIVE  NEGATIVE mg/dL   Urobilinogen, UA 1.0  0.0 - 1.0 mg/dL   Nitrite NEGATIVE  NEGATIVE   Leukocytes, UA NEGATIVE  NEGATIVE  URINE MICROSCOPIC-ADD ON      Result Value Range   WBC, UA 7-10  <3 WBC/hpf   RBC / HPF 7-10  <3 RBC/hpf   Bacteria, UA MANY (*) RARE        MDM  I personally performed the services described in this documentation, which  was scribed in my presence. The recorded information has been reviewed and is accurate.   Reviewed nursing notes and prior charts for additional history.  Date: 06/10/2012  Rate: 54  Rhythm: sinus bradycardia  QRS Axis: normal  Intervals: normal  ST/T Wave abnormalities: normal  Conduction Disutrbances:none  Narrative Interpretation:   Old EKG Reviewed: changes noted No acute significant change from prior x rate slower  ua w many bacteria, 7-10 wbc, will cx. Rocephin iv.  Recheck tolerating po fluids well. No nv. abd soft nt.   Pt denies any c/o, requests d/c.   John Roots, MD 06/10/12 [phone]

## 2012-06-11 LAB — URINE CULTURE

## 2015-08-18 ENCOUNTER — Inpatient Hospital Stay (HOSPITAL_COMMUNITY)
Admission: EM | Admit: 2015-08-18 | Discharge: 2015-08-21 | DRG: 084 | Disposition: A | Discharge status: Home Health | Payer: Medicare Other | Attending: General Surgery | Admitting: General Surgery

## 2015-08-18 ENCOUNTER — Encounter (HOSPITAL_COMMUNITY): Payer: Self-pay

## 2015-08-18 ENCOUNTER — Emergency Department (HOSPITAL_COMMUNITY): Payer: Medicare Other

## 2015-08-18 DIAGNOSIS — W19XXXA Unspecified fall, initial encounter: Secondary | ICD-10-CM | POA: Diagnosis present

## 2015-08-18 DIAGNOSIS — S42001A Fracture of unspecified part of right clavicle, initial encounter for closed fracture: Secondary | ICD-10-CM | POA: Diagnosis not present

## 2015-08-18 DIAGNOSIS — S066X9A Traumatic subarachnoid hemorrhage with loss of consciousness of unspecified duration, initial encounter: Secondary | ICD-10-CM | POA: Diagnosis not present

## 2015-08-18 DIAGNOSIS — I609 Nontraumatic subarachnoid hemorrhage, unspecified: Secondary | ICD-10-CM | POA: Diagnosis present

## 2015-08-18 DIAGNOSIS — R55 Syncope and collapse: Secondary | ICD-10-CM

## 2015-08-18 DIAGNOSIS — S065XAA Traumatic subdural hemorrhage with loss of consciousness status unknown, initial encounter: Secondary | ICD-10-CM | POA: Diagnosis present

## 2015-08-18 DIAGNOSIS — S065X9A Traumatic subdural hemorrhage with loss of consciousness of unspecified duration, initial encounter: Principal | ICD-10-CM | POA: Diagnosis present

## 2015-08-18 DIAGNOSIS — G510 Bell's palsy: Secondary | ICD-10-CM | POA: Diagnosis not present

## 2015-08-18 DIAGNOSIS — Z7982 Long term (current) use of aspirin: Secondary | ICD-10-CM

## 2015-08-18 DIAGNOSIS — Z91010 Allergy to peanuts: Secondary | ICD-10-CM

## 2015-08-18 DIAGNOSIS — Z91013 Allergy to seafood: Secondary | ICD-10-CM

## 2015-08-18 HISTORY — DX: Facial weakness: R29.810

## 2015-08-18 HISTORY — DX: Malignant (primary) neoplasm, unspecified: C80.1

## 2015-08-18 HISTORY — DX: Other amnesia: R41.3

## 2015-08-18 HISTORY — DX: Benign neoplasm of cranial nerves: D33.3

## 2015-08-18 LAB — CBC WITH DIFFERENTIAL/PLATELET
Basophils Absolute: 0 10*3/uL (ref 0.0–0.1)
Basophils Relative: 0 %
EOS ABS: 0.3 10*3/uL (ref 0.0–0.7)
Eosinophils Relative: 6 %
HCT: 40.2 % (ref 39.0–52.0)
HEMOGLOBIN: 13.3 g/dL (ref 13.0–17.0)
LYMPHS ABS: 0.9 10*3/uL (ref 0.7–4.0)
LYMPHS PCT: 18 %
MCH: 31.2 pg (ref 26.0–34.0)
MCHC: 33.1 g/dL (ref 30.0–36.0)
MCV: 94.4 fL (ref 78.0–100.0)
Monocytes Absolute: 0.2 10*3/uL (ref 0.1–1.0)
Monocytes Relative: 4 %
NEUTROS PCT: 72 %
Neutro Abs: 3.3 10*3/uL (ref 1.7–7.7)
Platelets: 129 10*3/uL — ABNORMAL LOW (ref 150–400)
RBC: 4.26 MIL/uL (ref 4.22–5.81)
RDW: 13.3 % (ref 11.5–15.5)
WBC: 4.7 10*3/uL (ref 4.0–10.5)

## 2015-08-18 LAB — BASIC METABOLIC PANEL
Anion gap: 4 — ABNORMAL LOW (ref 5–15)
BUN: 13 mg/dL (ref 6–20)
CHLORIDE: 108 mmol/L (ref 101–111)
CO2: 27 mmol/L (ref 22–32)
Calcium: 8.9 mg/dL (ref 8.9–10.3)
Creatinine, Ser: 1.14 mg/dL (ref 0.61–1.24)
GFR calc Af Amer: >60 mL/min (ref >60)
GFR calc non Af Amer: 56 mL/min — ABNORMAL LOW (ref >60)
Glucose, Bld: 107 mg/dL — ABNORMAL HIGH (ref 65–99)
POTASSIUM: 3.8 mmol/L (ref 3.5–5.1)
SODIUM: 139 mmol/L (ref 135–145)

## 2015-08-18 LAB — TROPONIN I

## 2015-08-18 LAB — GLUCOSE, CAPILLARY: GLUCOSE-CAPILLARY: 96 mg/dL (ref 65–99)

## 2015-08-18 MED ORDER — ONDANSETRON HCL 4 MG/2ML IJ SOLN: 4.0000 mg | Freq: Three times a day (TID) | INTRAMUSCULAR | Status: DC | PRN

## 2015-08-18 MED ORDER — MORPHINE SULFATE (PF) 2 MG/ML IV SOLN
2.0000 mg | INTRAVENOUS | Status: DC | PRN
Start: 2015-08-18 — End: 2015-08-19
  Administered 2015-08-19: 2 mg via INTRAVENOUS
  Filled 2015-08-18: qty 1

## 2015-08-18 MED ORDER — ACETAMINOPHEN 325 MG PO TABS
650.0000 mg | ORAL_TABLET | ORAL | Status: DC | PRN
  Administered 2015-08-19: 650 mg via ORAL
  Filled 2015-08-18: qty 2

## 2015-08-18 MED ORDER — POTASSIUM CHLORIDE IN NACL 20-0.9 MEQ/L-% IV SOLN
INTRAVENOUS | Status: DC
  Administered 2015-08-19: 50 mL/h via INTRAVENOUS
  Filled 2015-08-18 (×2): qty 1000

## 2015-08-18 MED ORDER — MORPHINE SULFATE (PF) 2 MG/ML IV SOLN
INTRAVENOUS | Status: AC
  Filled 2015-08-18: qty 1

## 2015-08-18 MED ORDER — ONDANSETRON HCL 4 MG/2ML IJ SOLN: 4.0000 mg | Freq: Four times a day (QID) | INTRAMUSCULAR | Status: DC | PRN

## 2015-08-18 MED ORDER — TETANUS-DIPHTH-ACELL PERTUSSIS 5-2.5-18.5 LF-MCG/0.5 IM SUSP
0.5000 mL | Freq: Once | INTRAMUSCULAR | Status: AC
  Administered 2015-08-18: 0.5 mL via INTRAMUSCULAR
  Filled 2015-08-18: qty 0.5

## 2015-08-18 MED ORDER — MORPHINE SULFATE (PF) 2 MG/ML IV SOLN
2.0000 mg | Freq: Once | INTRAVENOUS | Status: AC
Start: 2015-08-18 — End: 2015-08-18
  Administered 2015-08-18: 2 mg via INTRAVENOUS

## 2015-08-18 MED ORDER — TRAMADOL HCL 50 MG PO TABS
50.0000 mg | ORAL_TABLET | Freq: Four times a day (QID) | ORAL | Status: DC | PRN
  Administered 2015-08-19: 50 mg via ORAL
  Filled 2015-08-18: qty 1

## 2015-08-18 MED ORDER — ONDANSETRON HCL 4 MG PO TABS
4.0000 mg | ORAL_TABLET | Freq: Four times a day (QID) | ORAL | Status: DC | PRN
Start: 2015-08-18 — End: 2015-08-21

## 2015-08-18 NOTE — ED Notes (Signed)
[facility] stated they would notified us when a truck was in route to our facility for transport.

## 2015-08-18 NOTE — ED Notes (Addendum)
Pt was at the [employer] and wife saw pt fall and called EMS.  Pt does not remember falling.  Pt says the only thing he remembers from today was waking up on the ground with EMS standing over him.  Pt says he doesn't remember anything he did this morning.    Pt has hematoma on back of head and abrasion to R elbow and r shoulder.

## 2015-08-18 NOTE — ED Provider Notes (Signed)
CSN: [id number]     Arrival date & time 08/18/15  1420 History   First MD Initiated Contact with Patient 08/18/15 1427     Chief Complaint  Patient presents with  . Loss of Consciousness  . 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 He denies any history of stroke but states that he has had at least 3 or 4 episodes of transient global amnesia in the past. Today the patient presents after having a fall. According to his wife [place] all week. When he arrived home they went to the post office and while he was walking out of the post office he had a fall landing on his posterior lateral side, shoulder, elbow and head. There was a positive loss of consciousness, the patient does not remember anything, he does endorse having a headache but denies any other symptoms including numbness weakness or changes in vision. He does have a permanent decreased vision out of the right eye. The patient does not know when his last tetanus shot was. He does have increased pain with range of motion of the shoulder and elbow. He has had minimal bleeding. Paramedics transported the patient, no complaints of abnormal vital signs in route. The patient denies any other chest pain, shortness of breath, nausea, vomiting, diarrhea, swelling, rashes or any difficulty urinating. According to his wife he had a normal morning, was able to drive the entire way back to this area prior to this happening and has had loss of memory since the event. He does not remember anything prior to leaving this morning. He doesn't member eating breakfast.  Patient is a 82 y.o. male  presenting with syncope and shoulder pain. The history is provided by the patient.  Loss of Consciousness Shoulder Pain   Past Medical History  Diagnosis Date  . Bell's palsy   . Neuroma   . Other amnesia     "international globular amnesia."   Past Surgical History  Procedure Laterality Date  . Brain surgery     No family history on file. Social History  Substance Use Topics  . Smoking status: Never Smoker   . Smokeless tobacco: Never Used  . Alcohol Use: No     Comment: occ    Review of Systems  Cardiovascular: Positive for syncope.  All other systems reviewed and are negative.     Allergies  Fish allergy and Peanuts  Home Medications   Prior to Admission medications   Medication Sig Start Date End Date Taking? Authorizing Provider  aspirin 325 MG tablet Take 325 mg by mouth daily.     Yes Historical Provider, MD   BP 135/86 mmHg  Pulse 67  Temp(Src) 98.1 F (36.7 C) (Oral)  Resp 18  Ht 5\' 11"  (1.803 m)  Wt 140 lb (63.504 kg)  BMI 19.53 kg/m2  SpO2 97% Physical Exam  Constitutional: He appears well-developed and well-nourished. No distress.  HENT:  Head: Normocephalic.  Mouth/Throat: Oropharynx is clear and moist. No oropharyngeal exudate.  Contusion to the posterior occipital right-sided scalp, very small opening, no laceration, minimal bleeding. No malocclusion, there is facial droop on the right, no hemotympanum, no raccoon eyes, no  battle sign  Eyes: Conjunctivae and EOM are normal. Pupils are equal, round, and reactive to light. Right eye exhibits no discharge. Left eye exhibits no discharge. No scleral icterus.  Neck: Normal range of motion. Neck supple. No JVD present. No thyromegaly present.  Cardiovascular: Normal rate, regular rhythm, normal heart sounds and intact distal pulses.  Exam reveals no gallop and no friction rub.   No murmur heard. Pulmonary/Chest: Effort normal and breath sounds normal. No respiratory distress. He has no wheezes. He  has no rales.  Abdominal: Soft. Bowel sounds are normal. He exhibits no distension and no mass. There is no tenderness.  Musculoskeletal: Normal range of motion. He exhibits tenderness (ender to palpation over the right posterior lateral shoulder as well as the right elbow, normal range of motion of the elbow, decreased range of motion of the right shoulder secondary to pain). He exhibits no edema.  Lymphadenopathy:    He has no cervical adenopathy.  Neurological: He is alert. Coordination normal.  Facial droop is present, this is significant, he is unable to close the right eye, these are all chronic findings. He is able to move all 4 extremities with normal strength and coordination and there is no pronator drift. His speech is at baseline per his wife.  Skin: Skin is warm and dry. No rash noted. There is erythema (abrasions to the posterior right shoulder and the right elbow).  Psychiatric: He has a normal mood and affect. His behavior is normal.  Nursing note and vitals reviewed.   ED Course  Procedures (including critical care time) Labs Review Labs Reviewed  CBC WITH DIFFERENTIAL/PLATELET - Abnormal; Notable for the following:    Platelets 129 (*)    All other components within normal limits  BASIC METABOLIC PANEL - Abnormal; Notable for the following:    Glucose, Bld 107 (*)    GFR calc non Af Amer 56 (*)    Anion gap 4 (*)    All other components within normal limits  TROPONIN I    Imaging Review Dg Shoulder Right  08/18/2015  CLINICAL DATA:  Syncope.  Fall.  Bruising on top of right shoulder. EXAM: RIGHT SHOULDER - 2+ VIEW COMPARISON:  None. FINDINGS: There is a fracture through the distal right clavicle. Glenohumeral joint appears intact. No humeral abnormality. IMPRESSION: Fracture through the distal right clavicle. Electronically Signed   By: Rolm Baptise M.D.   On: 08/18/2015 15:35   Dg Elbow Complete Right  08/18/2015  CLINICAL DATA:  Syncope and fall today with a right  elbow injury. Pain. Initial encounter. EXAM: RIGHT ELBOW - COMPLETE 3+ VIEW COMPARISON:  None. FINDINGS: There is no evidence of fracture, dislocation, or joint effusion. There is no evidence of arthropathy or other focal bone abnormality. Soft tissues are unremarkable. IMPRESSION: Negative exam. Electronically Signed   By: Inge Rise M.D.   On: 08/18/2015 15:36   Ct Head Wo Contrast  08/18/2015  CLINICAL DATA:  Syncope and fall today.  Headache and neck pain. EXAM: CT HEAD WITHOUT CONTRAST CT CERVICAL SPINE WITHOUT CONTRAST TECHNIQUE: Multidetector CT imaging of the head and cervical spine was performed following the standard protocol without intravenous contrast. Multiplanar CT image reconstructions of the cervical spine were also generated. COMPARISON:  Head CT scan 02/18/2011. FINDINGS: CT HEAD FINDINGS Right occipital craniectomy defect and underlying encephalomalacia in the right cerebellum are again seen. There is some cortical atrophy and chronic microvascular ischemic change. A small amount of subarachnoid hemorrhage is seen in the  high left convexities. A thin subdural hemorrhage is also identified over the left convexities best seen along the left temporal lobe. There may be a small amount of subarachnoid hemorrhage in the left temporal lobe. No mass or midline shift is identified. No intraventricular hemorrhage is noted. Right temporal craniotomy defect is identified. No fracture is seen. CT CERVICAL SPINE FINDINGS No fracture is identified. Facet degenerative disease results in 0.3 cm anterolisthesis C4 on C5 alignment is otherwise normal. Multilevel facet arthropathy is noted. Loss of disc space height and endplate spurring are worst at C5-6 and C6-7. The lung apices are clear. IMPRESSION: The study is positive for subarachnoid hemorrhage in the high left convexities and likely left temporal lobe. Small left subdural hemorrhage is also identified. No midline shift or hydrocephalus. No calvarial  fracture. No acute abnormality cervical spine with multilevel spondylosis noted. Critical Value/emergent results were called by telephone at the time of interpretation on 08/18/2015 at 4:01 pm to Dr. Eber HongBRIAN Ladana Chavero , who verbally acknowledged these results. Electronically Signed   By: Drusilla Kannerhomas  Dalessio M.D.   On: 08/18/2015 16:02   Ct Cervical Spine Wo Contrast  08/18/2015  CLINICAL DATA:  Syncope and fall today.  Headache and neck pain. EXAM: CT HEAD WITHOUT CONTRAST CT CERVICAL SPINE WITHOUT CONTRAST TECHNIQUE: Multidetector CT imaging of the head and cervical spine was performed following the standard protocol without intravenous contrast. Multiplanar CT image reconstructions of the cervical spine were also generated. COMPARISON:  Head CT scan 02/18/2011. FINDINGS: CT HEAD FINDINGS Right occipital craniectomy defect and underlying encephalomalacia in the right cerebellum are again seen. There is some cortical atrophy and chronic microvascular ischemic change. A small amount of subarachnoid hemorrhage is seen in the high left convexities. A thin subdural hemorrhage is also identified over the left convexities best seen along the left temporal lobe. There may be a small amount of subarachnoid hemorrhage in the left temporal lobe. No mass or midline shift is identified. No intraventricular hemorrhage is noted. Right temporal craniotomy defect is identified. No fracture is seen. CT CERVICAL SPINE FINDINGS No fracture is identified. Facet degenerative disease results in 0.3 cm anterolisthesis C4 on C5 alignment is otherwise normal. Multilevel facet arthropathy is noted. Loss of disc space height and endplate spurring are worst at C5-6 and C6-7. The lung apices are clear. IMPRESSION: The study is positive for subarachnoid hemorrhage in the high left convexities and likely left temporal lobe. Small left subdural hemorrhage is also identified. No midline shift or hydrocephalus. No calvarial fracture. No acute abnormality  cervical spine with multilevel spondylosis noted. Critical Value/emergent results were called by telephone at the time of interpretation on 08/18/2015 at 4:01 pm to Dr. Eber HongBRIAN Larone Kliethermes , who verbally acknowledged these results. Electronically Signed   By: Drusilla Kannerhomas  Dalessio M.D.   On: 08/18/2015 16:02   I have personally reviewed and evaluated these images and lab results as part of my medical decision-making.   EKG Interpretation   Date/Time:  Monday August 18 2015 14:31:35 EDT Ventricular Rate:  66 PR Interval:  152 QRS Duration: 93 QT Interval:  395 QTC Calculation: 414 R Axis:   81 Text Interpretation:  Sinus rhythm Ventricular bigeminy Borderline right  axis deviation multiple PVC's - no other acute findings Since last tracing  PVC's now present Confirmed by Elexa Kivi  MD, Abbott Jasinski ([id number]) on 08/18/2015  3:00:06 PM      MDM   Final diagnoses:  SDH (subdural hematoma) (HCC)  SAH (subarachnoid hemorrhage) (HCC)  Syncope, unspecified syncope  type  Clavicle fracture, right, closed, initial encounter    The patient has had another episode of memory loss associated with a head injury and loss of consciousness. I will discuss this with neurology, CT scan of the brain to rule out other sources such as subarachnoid, subdural, epidural or cranial fracture. Imaging of the cervical spine is also indicated as well as labs to rule out any other sources of the patient's fall. He does not remember this and it was not witnessed by his wife.  D/w Dr. Merlene Laughter - recommends admit for syncope / stroke w/u - he will organize EEG - hospitalist admit once imaging and labs resulted. 3:16 PM  Discussed with Dr. Grandville Silos after CT scan results show that the patient has subarachnoid and subdural blood. The patient will be transferred to the ICU at [facility]. The patient is critically ill with intracranial hemorrhage, thankfully his vital signs appeared good, his mental status has remained stable.  CRITICAL  CARE Performed by: Johnna Acosta Total critical care time: 35 minutes Critical care time was exclusive of separately billable procedures and treating other patients. Critical care was necessary to treat or prevent imminent or life-threatening deterioration. Critical care was time spent personally by me on the following activities: development of treatment plan with patient and/or surrogate as well as nursing, discussions with consultants, evaluation of patient's response to treatment, examination of patient, obtaining history from patient or surrogate, ordering and performing treatments and interventions, ordering and review of laboratory studies, ordering and review of radiographic studies, pulse oximetry and re-evaluation of patient's condition.   Noemi Chapel, MD 08/18/15 [phone]

## 2015-08-18 NOTE — H&P (Signed)
John [name] is an 82 y.o. male.   Chief Complaint: TBI HPI: John [name] drove back from the beach earlier today. He went to the post office and was carrying a lot of packages outside. The door apparently struck him in the foot and he fell. He is amnestic to most of the event. Uncertain LOC. He was taken to [facility] emergency department for further evaluation. He was found to have a traumatic brain injury with small left subdural hematoma and small subarachnoid hemorrhage. Additionally, he has a right clavicle fracture. I accepted him in transfer for admission to the trauma service. He is complaining of some right shoulder pain and amnesia to most of the event from earlier today. Of note he has a chronic right facial droop status post resection of an acoustic neuroma in the past.  Past Medical History  Diagnosis Date  . Bell's palsy   . Neuroma   . Other amnesia     "international globular amnesia."  . Facial droop   . 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 Acoustic neuroma [facility])     Past Surgical History  Procedure Laterality Date  . Brain surgery      acoustic neuroma    No family history on file. Social History:  reports that he has never smoked. He has never used smokeless tobacco. He reports that he does not drink alcohol or use illicit drugs.  Allergies:  Allergies  Allergen Reactions  . Fish Allergy Shortness Of Breath  . Peanuts [Peanut Oil] Shortness Of Breath    Medications Prior to Admission  Medication Sig Dispense Refill  . aspirin 325 MG tablet Take 325 mg by mouth daily.        Results for orders placed or performed during the hospital encounter of 08/18/15 (from the past 48 hour(s))  CBC with Differential     Status: Abnormal   Collection Time: 08/18/15  2:42 PM  Result Value Ref Range   WBC 4.7 4.0 - 10.5 K/uL   RBC 4.26 4.22 - 5.81 MIL/uL   Hemoglobin 13.3 13.0 - 17.0 g/dL   HCT 40.2 39.0 - 52.0 %   MCV 94.4 78.0 - 100.0 fL   MCH 31.2 26.0 - 34.0 pg    MCHC 33.1 30.0 - 36.0 g/dL   RDW 13.3 11.5 - 15.5 %   Platelets 129 (L) 150 - 400 K/uL   Neutrophils Relative % 72 %   Neutro Abs 3.3 1.7 - 7.7 K/uL   Lymphocytes Relative 18 %   Lymphs Abs 0.9 0.7 - 4.0 K/uL   Monocytes Relative 4 %   Monocytes Absolute 0.2 0.1 - 1.0 K/uL   Eosinophils Relative 6 %   Eosinophils Absolute 0.3 0.0 - 0.7 K/uL   Basophils Relative 0 %   Basophils Absolute 0.0 0.0 - 0.1 K/uL  Basic metabolic panel     Status: Abnormal   Collection Time: 08/18/15  2:42 PM  Result Value Ref Range   Sodium 139 135 - 145 mmol/L   Potassium 3.8 3.5 - 5.1 mmol/L   Chloride 108 101 - 111 mmol/L   CO2 27 22 - 32 mmol/L   Glucose, Bld 107 (H) 65 - 99 mg/dL   BUN 13 6 - 20 mg/dL   Creatinine, Ser 1.14 0.61 - 1.24 mg/dL   Calcium 8.9 8.9 - 10.3 mg/dL   GFR calc non Af Amer 56 (L) >60 mL/min   GFR calc Af Amer >60 >60 mL/min  Comment: (NOTE) The eGFR has been calculated using the CKD EPI equation. This calculation has not been validated in all clinical situations. eGFR's persistently <60 mL/min signify possible Chronic Kidney Disease.    Anion gap 4 (L) 5 - 15  Troponin I     Status: None   Collection Time: 08/18/15  2:42 PM  Result Value Ref Range   Troponin I <0.03 <0.031 ng/mL    Comment:        NO INDICATION OF MYOCARDIAL INJURY.   Glucose, capillary     Status: None   Collection Time: 08/18/15  9:17 PM  Result Value Ref Range   Glucose-Capillary 96 65 - 99 mg/dL   Dg Shoulder Right  08/18/2015  CLINICAL DATA:  Syncope.  Fall.  Bruising on top of right shoulder. EXAM: RIGHT SHOULDER - 2+ VIEW COMPARISON:  None. FINDINGS: There is a fracture through the distal right clavicle. Glenohumeral joint appears intact. No humeral abnormality. IMPRESSION: Fracture through the distal right clavicle. Electronically Signed   By: Rolm Baptise M.D.   On: 08/18/2015 15:35   Dg Elbow Complete Right  08/18/2015  CLINICAL DATA:  Syncope and fall today with a right elbow injury.  Pain. Initial encounter. EXAM: RIGHT ELBOW - COMPLETE 3+ VIEW COMPARISON:  None. FINDINGS: There is no evidence of fracture, dislocation, or joint effusion. There is no evidence of arthropathy or other focal bone abnormality. Soft tissues are unremarkable. IMPRESSION: Negative exam. Electronically Signed   By: Inge Rise M.D.   On: 08/18/2015 15:36   Ct Head Wo Contrast  08/18/2015  CLINICAL DATA:  Syncope and fall today.  Headache and neck pain. EXAM: CT HEAD WITHOUT CONTRAST CT CERVICAL SPINE WITHOUT CONTRAST TECHNIQUE: Multidetector CT imaging of the head and cervical spine was performed following the standard protocol without intravenous contrast. Multiplanar CT image reconstructions of the cervical spine were also generated. COMPARISON:  Head CT scan 02/18/2011. FINDINGS: CT HEAD FINDINGS Right occipital craniectomy defect and underlying encephalomalacia in the right cerebellum are again seen. There is some cortical atrophy and chronic microvascular ischemic change. A small amount of subarachnoid hemorrhage is seen in the high left convexities. A thin subdural hemorrhage is also identified over the left convexities best seen along the left temporal lobe. There may be a small amount of subarachnoid hemorrhage in the left temporal lobe. No mass or midline shift is identified. No intraventricular hemorrhage is noted. Right temporal craniotomy defect is identified. No fracture is seen. CT CERVICAL SPINE FINDINGS No fracture is identified. Facet degenerative disease results in 0.3 cm anterolisthesis C4 on C5 alignment is otherwise normal. Multilevel facet arthropathy is noted. Loss of disc space height and endplate spurring are worst at C5-6 and C6-7. The lung apices are clear. IMPRESSION: The study is positive for subarachnoid hemorrhage in the high left convexities and likely left temporal lobe. Small left subdural hemorrhage is also identified. No midline shift or hydrocephalus. No calvarial fracture. No  acute abnormality cervical spine with multilevel spondylosis noted. Critical Value/emergent results were called by telephone at the time of interpretation on 08/18/2015 at 4:01 pm to Dr. Noemi Chapel , who verbally acknowledged these results. Electronically Signed   By: Inge Rise M.D.   On: 08/18/2015 16:02   Ct Cervical Spine Wo Contrast  08/18/2015  CLINICAL DATA:  Syncope and fall today.  Headache and neck pain. EXAM: CT HEAD WITHOUT CONTRAST CT CERVICAL SPINE WITHOUT CONTRAST TECHNIQUE: Multidetector CT imaging of the head and cervical spine was performed  following the standard protocol without intravenous contrast. Multiplanar CT image reconstructions of the cervical spine were also generated. COMPARISON:  Head CT scan 02/18/2011. FINDINGS: CT HEAD FINDINGS Right occipital craniectomy defect and underlying encephalomalacia in the right cerebellum are again seen. There is some cortical atrophy and chronic microvascular ischemic change. A small amount of subarachnoid hemorrhage is seen in the high left convexities. A thin subdural hemorrhage is also identified over the left convexities best seen along the left temporal lobe. There may be a small amount of subarachnoid hemorrhage in the left temporal lobe. No mass or midline shift is identified. No intraventricular hemorrhage is noted. Right temporal craniotomy defect is identified. No fracture is seen. CT CERVICAL SPINE FINDINGS No fracture is identified. Facet degenerative disease results in 0.3 cm anterolisthesis C4 on C5 alignment is otherwise normal. Multilevel facet arthropathy is noted. Loss of disc space height and endplate spurring are worst at C5-6 and C6-7. The lung apices are clear. IMPRESSION: The study is positive for subarachnoid hemorrhage in the high left convexities and likely left temporal lobe. Small left subdural hemorrhage is also identified. No midline shift or hydrocephalus. No calvarial fracture. No acute abnormality cervical spine  with multilevel spondylosis noted. Critical Value/emergent results were called by telephone at the time of interpretation on 08/18/2015 at 4:01 pm to Dr. Noemi Chapel , who verbally acknowledged these results. Electronically Signed   By: Inge Rise M.D.   On: 08/18/2015 16:02    Review of Systems  Constitutional: Negative for fever.  Eyes:       Chronic decreased vision right eye associated with right facial droop  Respiratory: Negative for cough and shortness of breath.   Cardiovascular: Negative for chest pain.  Gastrointestinal: Negative for nausea, vomiting and abdominal pain.  Genitourinary: Negative.   Musculoskeletal:       Right shoulder pain  Skin: Negative.   Neurological: Positive for loss of consciousness. Negative for sensory change, speech change, seizures and headaches.  Endo/Heme/Allergies: Negative.   Psychiatric/Behavioral: Negative.     Blood pressure 126/63, pulse 65, temperature 98.7 F (37.1 C), temperature source Oral, resp. rate 19, height 5' 11"  (1.803 m), weight 63.504 kg (140 lb), SpO2 97 %. Physical Exam  Constitutional: He appears well-developed and well-nourished. No distress.  HENT:  Right Ear: External ear normal.  Left Ear: External ear normal.  Nose: Nose normal.  Mouth/Throat: Oropharynx is clear and moist.  Right facial droop  Eyes:  Left pupil 3 mm and reacts, right eye moderate opacity and chronic changes, extraocular muscles intact  Neck: No tracheal deviation present.  No posterior midline tenderness, no pain on active range of motion  Cardiovascular: Normal rate.   2/6 systolic ejection murmur  Respiratory: Effort normal and breath sounds normal. No stridor. No respiratory distress. He has no wheezes. He has no rales.  GI: Soft. He exhibits no distension. There is no tenderness. There is no rebound and no guarding.  Musculoskeletal:  Tender deformity right distal clavicle  Lymphadenopathy:    He has no cervical adenopathy.   Neurological: He is alert. He displays no atrophy and no tremor. He exhibits normal muscle tone. He displays no seizure activity. GCS eye subscore is 4. GCS verbal subscore is 5. GCS motor subscore is 6.  Right facial droop  Skin: Skin is warm.  Psychiatric: He has a normal mood and affect.     Assessment/Plan Fall TBI/small L SDH/SAH - Dr. Saintclair Halsted to see in AM. F/U CT head in AM.,  Follow neuro exam. R clavicle FX - Dr. Alvan Dame to see in AM. Sling.  Admit to ICU  Zenovia Jarred, MD 08/18/2015, 9:54 PM

## 2015-08-19 ENCOUNTER — Inpatient Hospital Stay (HOSPITAL_COMMUNITY): Payer: Medicare Other

## 2015-08-19 LAB — CBC
HEMATOCRIT: 40.5 % (ref 39.0–52.0)
HEMOGLOBIN: 13.3 g/dL (ref 13.0–17.0)
MCH: 30.6 pg (ref 26.0–34.0)
MCHC: 32.8 g/dL (ref 30.0–36.0)
MCV: 93.1 fL (ref 78.0–100.0)
PLATELETS: 121 10*3/uL — AB (ref 150–400)
RBC: 4.35 MIL/uL (ref 4.22–5.81)
RDW: 13.1 % (ref 11.5–15.5)
WBC: 7 10*3/uL (ref 4.0–10.5)

## 2015-08-19 LAB — BASIC METABOLIC PANEL
ANION GAP: 5 (ref 5–15)
BUN: 11 mg/dL (ref 6–20)
CHLORIDE: 106 mmol/L (ref 101–111)
CO2: 27 mmol/L (ref 22–32)
Calcium: 8.9 mg/dL (ref 8.9–10.3)
Creatinine, Ser: 1.15 mg/dL (ref 0.61–1.24)
GFR calc Af Amer: >60 mL/min (ref >60)
GFR, EST NON AFRICAN AMERICAN: 56 mL/min — AB (ref >60)
GLUCOSE: 98 mg/dL (ref 65–99)
POTASSIUM: 4.1 mmol/L (ref 3.5–5.1)
Sodium: 138 mmol/L (ref 135–145)

## 2015-08-19 LAB — MRSA PCR SCREENING: MRSA BY PCR: POSITIVE — AB

## 2015-08-19 LAB — GLUCOSE, CAPILLARY
Glucose-Capillary: 103 mg/dL — ABNORMAL HIGH (ref 65–99)
Glucose-Capillary: 104 mg/dL — ABNORMAL HIGH (ref 65–99)

## 2015-08-19 MED ORDER — SODIUM CHLORIDE 0.9% FLUSH: 3.0000 mL | INTRAVENOUS | Status: DC | PRN
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
## 2015-08-19 NOTE — Progress Notes (Signed)
Patient ID: John [name], male   DOB: 01-Dec-1927, 82 y.o.   MRN: [id number]  LOS: 1 day   Subjective: Denies headaches, dizziness, nausea.  Denies vision loss, weakness or paresthesias.  Only complaint is right clavicle pain. VSS.  Afebrile. Labs are stable. CTH yet to be done.  Objective: Vital signs in last 24 hours: Temp:  [98.1 F (36.7 C)-99.7 F (37.6 C)] 98.7 F (37.1 C) (06/06 0724) Pulse Rate:  [57-73] 67 (06/06 0600) Resp:  [10-19] 18 (06/06 0600) BP: (108-157)/(56-93) 109/68 mmHg (06/06 0600) SpO2:  [91 %-100 %] 92 % (06/06 0600) Weight:  [63.504 kg (140 lb)] 63.504 kg (140 lb) (06/05 1426)    Lab Results:  CBC  Recent Labs  08/18/15 1442 08/19/15 0241  WBC 4.7 7.0  HGB 13.3 13.3  HCT 40.2 40.5  PLT 129* 121*   BMET  Recent Labs  08/18/15 1442 08/19/15 0241  NA 139 138  K 3.8 4.1  CL 108 106  CO2 27 27  GLUCOSE 107* 98  BUN 13 11  CREATININE 1.14 1.15  CALCIUM 8.9 8.9    Imaging: Dg Shoulder Right  08/18/2015  CLINICAL DATA:  Syncope.  Fall.  Bruising on top of right shoulder. EXAM: RIGHT SHOULDER - 2+ VIEW COMPARISON:  None. FINDINGS: There is a fracture through the distal right clavicle. Glenohumeral joint appears intact. No humeral abnormality. IMPRESSION: Fracture through the distal right clavicle. Electronically Signed   By: Rolm Baptise M.D.   On: 08/18/2015 15:35   Dg Elbow Complete Right  08/18/2015  CLINICAL DATA:  Syncope and fall today with a right elbow injury. Pain. Initial encounter. EXAM: RIGHT ELBOW - COMPLETE 3+ VIEW COMPARISON:  None. FINDINGS: There is no evidence of fracture, dislocation, or joint effusion. There is no evidence of arthropathy or other focal bone abnormality. Soft tissues are unremarkable. IMPRESSION: Negative exam. Electronically Signed   By: Inge Rise M.D.   On: 08/18/2015 15:36   Ct Head Wo Contrast  08/18/2015  CLINICAL DATA:  Syncope and fall today.  Headache and neck pain. EXAM: CT HEAD WITHOUT CONTRAST  CT CERVICAL SPINE WITHOUT CONTRAST TECHNIQUE: Multidetector CT imaging of the head and cervical spine was performed following the standard protocol without intravenous contrast. Multiplanar CT image reconstructions of the cervical spine were also generated. COMPARISON:  Head CT scan 02/18/2011. FINDINGS: CT HEAD FINDINGS Right occipital craniectomy defect and underlying encephalomalacia in the right cerebellum are again seen. There is some cortical atrophy and chronic microvascular ischemic change. A small amount of subarachnoid hemorrhage is seen in the high left convexities. A thin subdural hemorrhage is also identified over the left convexities best seen along the left temporal lobe. There may be a small amount of subarachnoid hemorrhage in the left temporal lobe. No mass or midline shift is identified. No intraventricular hemorrhage is noted. Right temporal craniotomy defect is identified. No fracture is seen. CT CERVICAL SPINE FINDINGS No fracture is identified. Facet degenerative disease results in 0.3 cm anterolisthesis C4 on C5 alignment is otherwise normal. Multilevel facet arthropathy is noted. Loss of disc space height and endplate spurring are worst at C5-6 and C6-7. The lung apices are clear. IMPRESSION: The study is positive for subarachnoid hemorrhage in the high left convexities and likely left temporal lobe. Small left subdural hemorrhage is also identified. No midline shift or hydrocephalus. No calvarial fracture. No acute abnormality cervical spine with multilevel spondylosis noted. Critical Value/emergent results were called by telephone at the time of interpretation  on 08/18/2015 at 4:01 pm to Dr. Noemi Chapel , who verbally acknowledged these results. Electronically Signed   By: Inge Rise M.D.   On: 08/18/2015 16:02   Ct Cervical Spine Wo Contrast  08/18/2015  CLINICAL DATA:  Syncope and fall today.  Headache and neck pain. EXAM: CT HEAD WITHOUT CONTRAST CT CERVICAL SPINE WITHOUT  CONTRAST TECHNIQUE: Multidetector CT imaging of the head and cervical spine was performed following the standard protocol without intravenous contrast. Multiplanar CT image reconstructions of the cervical spine were also generated. COMPARISON:  Head CT scan 02/18/2011. FINDINGS: CT HEAD FINDINGS Right occipital craniectomy defect and underlying encephalomalacia in the right cerebellum are again seen. There is some cortical atrophy and chronic microvascular ischemic change. A small amount of subarachnoid hemorrhage is seen in the high left convexities. A thin subdural hemorrhage is also identified over the left convexities best seen along the left temporal lobe. There may be a small amount of subarachnoid hemorrhage in the left temporal lobe. No mass or midline shift is identified. No intraventricular hemorrhage is noted. Right temporal craniotomy defect is identified. No fracture is seen. CT CERVICAL SPINE FINDINGS No fracture is identified. Facet degenerative disease results in 0.3 cm anterolisthesis C4 on C5 alignment is otherwise normal. Multilevel facet arthropathy is noted. Loss of disc space height and endplate spurring are worst at C5-6 and C6-7. The lung apices are clear. IMPRESSION: The study is positive for subarachnoid hemorrhage in the high left convexities and likely left temporal lobe. Small left subdural hemorrhage is also identified. No midline shift or hydrocephalus. No calvarial fracture. No acute abnormality cervical spine with multilevel spondylosis noted. Critical Value/emergent results were called by telephone at the time of interpretation on 08/18/2015 at 4:01 pm to Dr. Noemi Chapel , who verbally acknowledged these results. Electronically Signed   By: Inge Rise M.D.   On: 08/18/2015 16:02     PE: General appearance: alert, cooperative and no distress Head: Normocephalic, without obvious abnormality, atraumatic Eyes: conjunctivae/corneas clear. PERRL, EOM's intact. Fundi benign.,  right eye distorted.  left pupil is reative.  Resp: clear to auscultation bilaterally Cardio: regular rate and rhythm, S1, S2 normal, no murmur, click, rub or gallop GI: soft, non-tender; bowel sounds normal; no masses,  no organomegaly Extremities: rt arm sling Neurologic: right sided facial droop(chronic) otherwise normal       Patient Active Problem List   Diagnosis Date Noted  . SDH (subdural hematoma) ([place]) 08/18/2015  . Subdural hematoma ([place]) 08/18/2015  . Orthostatic hypotension 02/18/2011  . Dizziness 02/18/2011  . S/P excision of acoustic neuroma 02/18/2011  . Facial droop chronic right 02/18/2011  . Dehydration 02/18/2011  . Pulmonary nodule 02/18/2011    Assessment/Plan: Fall TBI/small L SDH/SAH-Dr. Saintclair Halsted to see.  Repeat CTH this AM.  Continue neuro exam Rt clavicle fracture-sling, Dr. Alvan Dame to see VTE - SCD's FEN - advance diet Dispo -- if [facility] okay, transfer to floor and order PT/OT eval   Erby Pian, ANP-BC Pager: [phone] General Trauma PA Pager: [phone]   08/19/2015 7:40 AM
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
## 2015-08-19 NOTE — Progress Notes (Signed)
Pt arrived on unit after receiving report from [place], Therapist, sports. Pt alert and oriented to room and unit upon arrival.

## 2015-08-20 DIAGNOSIS — S42001A Fracture of unspecified part of right clavicle, initial encounter for closed fracture: Secondary | ICD-10-CM | POA: Diagnosis present

## 2015-08-20 MED ORDER — CHLORHEXIDINE GLUCONATE CLOTH 2 % EX PADS
6.0000 | MEDICATED_PAD | Freq: Every day | CUTANEOUS | Status: DC
  Administered 2015-08-20: 6 via TOPICAL

## 2015-08-20 MED ORDER — MUPIROCIN 2 % EX OINT
1.0000 | TOPICAL_OINTMENT | Freq: Two times a day (BID) | CUTANEOUS | Status: DC
Start: 2015-08-20 — End: 2015-08-21
  Administered 2015-08-20 – 2015-08-21 (×2): 1 via NASAL
  Filled 2015-08-20: qty 22
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
## 2015-08-20 NOTE — Progress Notes (Signed)
RN returned to room to check on patient and inquire if pt needed anything. While in room pt's family stated that pt appeared to be back to normal and was functioning as before the reported black out. Pt's wife asked if pt would be getting discharged today and stated that this is what happens when he (patient) doesn't take his aspirin. While RN was trying to educate family on why patient was not prescribed aspirin, pt's wife stated that she gave him one. RN asked spouse how much she gave and wife stated that she gave 325mg . Pt's son was also in the room during situation and confirmed wife's story. RN educated spouse on why medication could be negative for pt. Spouse stated that she understood and that all the responsibility could fall on her. MD later came to room to check on patient as well. MD informed by RN of wife giving pt 325mg  of Aspirin. RN will continue to monitor patient.

## 2015-08-20 NOTE — Evaluation (Signed)
Physical Therapy Evaluation Patient Details Name: John [name] MRN: [id number] DOB: Sep 24, 1927 Today's Date: 08/20/2015   History of Present Illness  82 yo male s/p fall exiting [employer] striking leg on door. Pt landing on R side and s/p R clavicle fx pending ortho Dr Alvan Dame assessment. (+) L SDH/ [facility] PMH: Bells palsy, neuroma, facial droop, R ear tumor CA,  Clinical Impression  *Pt admitted with above. Pt with noted impaired balance and increased falls risk. Pt with decreased safety awarenss and insight to deficits. Recommend SNF upon d/c to improve indep with mobility. Spoke with pt regarding him and his wife transferring into ALF. Pt agreed "that might be a good idea"    Follow Up Recommendations SNF;Supervision/Assistance - 24 hour    Equipment Recommendations  Rolling walker with 5" wheels    Recommendations for Other Services       Precautions / Restrictions Precautions Precautions: Fall Precaution Comments: R clavicle fx pending Dr Alvan Dame recommendations Required Braces or Orthoses: Sling Restrictions Weight Bearing Restrictions: Yes RUE Weight Bearing: Non weight bearing      Mobility  Bed Mobility Overal bed mobility: Needs Assistance Bed Mobility: Rolling;Supine to Sit Rolling: Mod assist   Supine to sit: Mod assist     General bed mobility comments: educated on exiting on the L side due to R UE injury. pt attempting to use R hand several times and therapist placing hand over hand R UE to keep in correctly positioned with max v/c  Transfers Overall transfer level: Needs assistance Equipment used: 1 person hand held assist Transfers: Sit to/from Stand Sit to Stand: Min assist         General transfer comment: cues for hand placement  Ambulation/Gait Ambulation/Gait assistance: Min assist Ambulation Distance (Feet): 150 Feet Assistive device: 1 person hand held assist Gait Pattern/deviations: Step-through pattern;Decreased stride length;Staggering  left;Staggering right Gait velocity: slow Gait velocity interpretation: Below normal speed for age/gender General Gait Details: pt staggering but no overt LOB, pt with mild LOB with head turning but able to sellf correct.   Stairs            Wheelchair Mobility    Modified Rankin (Stroke Patients Only)       Balance Overall balance assessment: Needs assistance Sitting-balance support: No upper extremity supported Sitting balance-Leahy Scale: Fair     Standing balance support: Single extremity supported Standing balance-Leahy Scale: Poor                               Pertinent Vitals/Pain Pain Assessment: Faces Faces Pain Scale: Hurts a little bit Pain Location: r shld pain with mvmt Pain Descriptors / Indicators: Discomfort Pain Intervention(s): Monitored during session    Home Living Family/patient expects to be discharged to:: Skilled nursing facility Living Arrangements: Spouse/significant other Available Help at Discharge: Family;Available 24 hours/day Type of Home: House Home Access: Level entry     Home Layout: One level Home Equipment: None Additional Comments: drives, wife walks with a cane    Prior Function Level of Independence: Independent               Hand Dominance   Dominant Hand: Right    Extremity/Trunk Assessment   Upper Extremity Assessment: RUE deficits/detail RUE Deficits / Details: limited movement awaiting Dr Alvan Dame recommendations from Ortho         Lower Extremity Assessment: Generalized weakness      Cervical / Trunk Assessment:  Kyphotic  Communication   Communication: No difficulties  Cognition Arousal/Alertness: Awake/alert Behavior During Therapy: WFL for tasks assessed/performed Overall Cognitive Status: Impaired/Different from baseline Area of Impairment: Awareness;Safety/judgement         Safety/Judgement: Decreased awareness of deficits;Decreased awareness of safety Awareness: Emergent    General Comments: Pt attempting to move R UE but will verbalize "i shouldnt move it" Pt is very R hand dominant and even when instructed to use L UE.     General Comments General comments (skin integrity, edema, etc.): discussed him and his wife going into ALF    Exercises        Assessment/Plan    PT Assessment Patient needs continued PT services  PT Diagnosis Generalized weakness;Difficulty walking   PT Problem List Decreased strength;Decreased activity tolerance;Decreased balance;Decreased mobility;Decreased range of motion;Decreased coordination;Decreased cognition;Decreased safety awareness  PT Treatment Interventions DME instruction;Gait training;Stair training;Therapeutic activities;Therapeutic exercise;Functional mobility training;Balance training;Neuromuscular re-education   PT Goals (Current goals can be found in the Care Plan section) Acute Rehab PT Goals Patient Stated Goal: none stated PT Goal Formulation: With patient Time For Goal Achievement: 08/27/15 Potential to Achieve Goals: Good    Frequency Min 3X/week   Barriers to discharge Decreased caregiver support has elderly wife who uses cane and has h/o falls    Co-evaluation               End of Session Equipment Utilized During Treatment: Gait belt Activity Tolerance: Patient tolerated treatment well Patient left: in chair (with CSW present) Nurse Communication: Mobility status         Time: [id number] PT Time Calculation (min) (ACUTE ONLY): 21 min   Charges:   PT Evaluation $PT Eval Moderate Complexity: 1 Procedure     PT G CodesKingsley Callander 08/20/2015, 2:06 PM   Kittie Plater, PT, DPT Pager #: [phone] Office #: [phone]

## 2015-08-20 NOTE — Progress Notes (Signed)
While rounding on patient's, pt's chair alarm went off. When RN went to investigate, pt's family stated that pt passed out and was waking back up. Pt appeared disoriented at first and repeatedly tried to ambulate to restroom. RN assessed pt who was A&O x3. Pt appeared not to remember that right arm was injured and was slurring speech more prominently. RN was successful in having pt to use urinal in chair. MD notified of situation and will continue to monitor pt.

## 2015-08-20 NOTE — Progress Notes (Signed)
Patient ID: John [name], male   DOB: 1927-07-30, 82 y.o.   MRN: [id number]   LOS: 2 days   Subjective: No new c/o. Feels like he could get along at home ok.   Objective: Vital signs in last 24 hours: Temp:  [98 F (36.7 C)-98.6 F (37 C)] 98.5 F (36.9 C) (06/07 0536) Pulse Rate:  [55-95] 95 (06/07 0536) Resp:  [14-21] 18 (06/07 0536) BP: (103-129)/(58-69) 129/68 mmHg (06/07 0536) SpO2:  [93 %-99 %] 99 % (06/07 0536)    Physical Exam General appearance: alert and no distress Resp: clear to auscultation bilaterally Cardio: regular rate and rhythm GI: normal findings: bowel sounds normal and soft, non-tender   Assessment/Plan: Fall TBI/small L SDH/SAH- Stable Rt clavicle fracture - sling, Dr. Alvan Dame to see VTE - SCD's FEN - No issues Dispo -- Likely home after PT/OT today; pt not really willing to go to SNF 2/2 being primary caregiver for his wife and not having family in area. Will check this afternoon.    Lisette Abu, PA-C Pager: [phone] General Trauma PA Pager: [phone]  08/20/2015

## 2015-08-20 NOTE — Progress Notes (Signed)
Patient ID: John [name], male   DOB: 1927/08/23, 82 y.o.   MRN: [id number] Neurologically stable awake alert no headache  CT stable no neurosurgical recommendations.

## 2015-08-20 NOTE — Evaluation (Signed)
Occupational Therapy Evaluation Patient Details Name: John [name] MRN: [id number] DOB: 1927/12/18 Today's Date: 08/20/2015    History of Present Illness 82 yo male s/p fall exiting [employer] striking leg on door. Pt landing on R side and s/p R clavicle fx pending ortho Dr Alvan Dame assessment. (+) L SDH/ [facility] PMH: Bells palsy, neuroma, facial droop, R ear tumor CA,   Clinical Impression   PT admitted with L SDH/ SAH and R clavicle fx. Pt currently with functional limitiations due to the deficits listed below (see OT problem list). Pt with pending ortho recommendations for R UE. Pt at baseline drives himself and wife.Pts wife uses a cane at baseline and question ability to physically (A) patient. Pt oriented to self time and situation but lacks awareness to deficits from fall. Question ability to return home due to no family in the area per patient. Pt will rely on wife for all assistance.  Pt will benefit from skilled OT to increase their independence and safety with adls and balance to allow discharge SNF.     Follow Up Recommendations  SNF;Supervision - Intermittent    Equipment Recommendations  3 in 1 bedside comode    Recommendations for Other Services       Precautions / Restrictions Precautions Precautions: Fall Precaution Comments: R clavicle fx pending Dr Alvan Dame recommendations Required Braces or Orthoses: Sling Restrictions Weight Bearing Restrictions: Yes RUE Weight Bearing: Non weight bearing (until orders clarified)      Mobility Bed Mobility Overal bed mobility: Needs Assistance Bed Mobility: Rolling;Supine to Sit Rolling: Mod assist   Supine to sit: Mod assist     General bed mobility comments: educated on exiting on the L side due to R UE injury. pt attempting to use R hand several times and therapist placing hand over hand R UE to keep in correctly positioned with max v/c  Transfers Overall transfer level: Needs assistance Equipment used: 1 person hand held  assist Transfers: Sit to/from Stand Sit to Stand: Min assist         General transfer comment: cuse for hand placement    Balance Overall balance assessment: Needs assistance Sitting-balance support: Single extremity supported;Feet supported Sitting balance-Leahy Scale: Fair     Standing balance support: Single extremity supported;During functional activity Standing balance-Leahy Scale: Poor                              ADL Overall ADL's : Needs assistance/impaired Eating/Feeding: Set up;Sitting   Grooming: Wash/dry hands;Wash/dry face;Set up;Sitting   Upper Body Bathing: Maximal assistance;Sitting         Upper Body Dressing Details (indicate cue type and reason): total (A) to don sling. pt completely unaware sling is present and states "they didnt give me a sling" Lower Body Dressing: Maximal assistance   Toilet Transfer: Minimal assistance (hand held (A))           Functional mobility during ADLs: Minimal assistance General ADL Comments: Pt reports that he prefers to be around few people and likes being to himself. pt demonstrates need for (A) exiting the bed. Pt with no recall of events and first memory is waking up at [place] has elderly wife that uses a cane at baseline. In the room a bag is present with wife's medication that she left in patients room at some point in previous visit. Question wifes ability to truly (A)      Vision Additional  Comments: Bells Palsy inability to close R eye, R facial droop   Perception     Praxis      Pertinent Vitals/Pain Pain Assessment: 0-10 Pain Score: 2  Pain Location: R clavical pain Pain Descriptors / Indicators: Discomfort Pain Intervention(s): Repositioned;Monitored during session;Limited activity within patient's tolerance     Hand Dominance Right   Extremity/Trunk Assessment Upper Extremity Assessment Upper Extremity Assessment: RUE deficits/detail RUE Deficits / Details:  limited movement awaiting Dr Alvan Dame recommendations from Ortho   Lower Extremity Assessment Lower Extremity Assessment: Defer to PT evaluation   Cervical / Trunk Assessment Cervical / Trunk Assessment: Kyphotic   Communication Communication Communication: No difficulties   Cognition Arousal/Alertness: Awake/alert Behavior During Therapy: WFL for tasks assessed/performed Overall Cognitive Status: Impaired/Different from baseline Area of Impairment: Awareness           Awareness: Emergent   General Comments: Pt attempting to move R UE but will verbalize "i shouldnt move it" Pt is very R hand dominant and even when instructed to use L UE.    General Comments       Exercises       Shoulder Instructions      Home Living Family/patient expects to be discharged to:: Private residence Living Arrangements: Spouse/significant other Available Help at Discharge: Family;Available 24 hours/day Type of Home: House Home Access: Level entry     Home Layout: One level     Bathroom Shower/Tub: Teacher, early years/pre: Standard     Home Equipment: None   Additional Comments: drives, wife walks with a cane      Prior Functioning/Environment Level of Independence: Independent             OT Diagnosis: Generalized weakness;Acute pain   OT Problem List: Decreased strength;Decreased range of motion;Decreased activity tolerance;Impaired balance (sitting and/or standing);Decreased safety awareness;Decreased cognition;Decreased knowledge of use of DME or AE;Decreased knowledge of precautions;Pain;Impaired UE functional use   OT Treatment/Interventions: Self-care/ADL training;Therapeutic exercise;DME and/or AE instruction;Energy conservation;Therapeutic activities;Cognitive remediation/compensation;Patient/family education;Balance training    OT Goals(Current goals can be found in the care plan section) Acute Rehab OT Goals Patient Stated Goal: none stated OT Goal  Formulation: With patient Time For Goal Achievement: 09/03/15 Potential to Achieve Goals: Good  OT Frequency: Min 2X/week   Barriers to D/C: Other (comment)  elderly wife and questions (A) level       Co-evaluation              End of Session Equipment Utilized During Treatment: Gait belt Nurse Communication: Mobility status;Precautions  Activity Tolerance: Patient tolerated treatment well Patient left: in chair;with call bell/phone within reach;with chair alarm set   Time: [phone] OT Time Calculation (min): 22 min Charges:  OT General Charges $OT Visit: 1 Procedure OT Evaluation $OT Eval Moderate Complexity: 1 Procedure G-Codes:    Parke Poisson B 2015-09-04, 8:11 AM   Jeri Modena   OTR/L PagerIP:[phone] Office: [phone] .

## 2015-08-21 MED ORDER — ASPIRIN 325 MG PO TABS: 325.0000 mg | ORAL_TABLET | Freq: Every day | ORAL | Status: DC | PRN

## 2015-08-21 NOTE — Care Management Note (Signed)
Case Management Note  Patient Details  Name: John [name] MRN: [id number] Date of Birth: 1927-11-27  Subjective/Objective:   Pt admitted on 08/18/15 s/p fall with SDH and clavicle fracture.  PTA, pt resides at home with spouse.  PT/OT recommending SNF.                   Action/Plan: Met with pt, wife and son to discuss dc plans.  Son lives in [place], but will be staying with pt/wife for the next few days.  He states they will arrange private duty sitters/aides to assist with 24h care for the next couple of weeks to help pt and wife at home, and would prefer that pt dc home, not SNF.   [place] prepared Private Duty/sitter list given to son, per request.   Referral made to [facility] for [facility] follow up, per pt/wife choice. (they have used this agency in the past).  Pt states he has RW and BSC at home, and does not need any DME ordered.  PA notified of pt/family plan to dc home with [facility] and private duty sitters to supplement care.  PA to discharge home today.    Expected Discharge Date:    08/21/2015              Expected Discharge Plan:  [place]  In-House Referral:  Clinical Social Work  Discharge planning Services  CM Consult  Post Acute Care Choice:    Choice offered to:     DME Arranged:    DME Agency:     HH Arranged:  PT, OT HH Agency:  [facility]  Status of Service:  Completed, signed off  Medicare Important Message Given:    Date Medicare IM Given:    Medicare IM give by:    Date Additional Medicare IM Given:    Additional Medicare Important Message give by:     If discussed at [place] of Stay Meetings, dates discussed:    Additional Comments:  Reinaldo Raddle, RN, BSN  Trauma/Neuro ICU Case Manager [phone]

## 2015-08-21 NOTE — Progress Notes (Signed)
Patient ID: BERNICE ZIMNY, male   DOB: March 23, 1927, 82 y.o.   MRN: [id number]   LOS: 3 days   Subjective: No change   Objective: Vital signs in last 24 hours: Temp:  [97.7 F (36.5 C)-98.6 F (37 C)] 98 F (36.7 C) (06/08 1013) Pulse Rate:  [62-97] 62 (06/08 1013) Resp:  [16-20] 18 (06/08 1013) BP: (104-144)/(60-72) 128/70 mmHg (06/08 1013) SpO2:  [94 %-99 %] 96 % (06/08 1013)    Laboratory  CBC  Recent Labs  08/18/15 1442 08/19/15 0241  WBC 4.7 7.0  HGB 13.3 13.3  HCT 40.2 40.5  PLT 129* 121*   BMET  Recent Labs  08/18/15 1442 08/19/15 0241  NA 139 138  K 3.8 4.1  CL 108 106  CO2 27 27  GLUCOSE 107* 98  BUN 13 11  CREATININE 1.14 1.15  CALCIUM 8.9 8.9    Physical Exam General appearance: alert and no distress Resp: clear to auscultation bilaterally Cardio: regular rate and rhythm GI: normal findings: bowel sounds normal and soft, non-tender   Assessment/Plan: Fall TBI/small L SDH/SAH- Stable Rt clavicle fracture - sling, f/u with Dr. Alvan Dame as OP VTE - SCD's FEN - No issues Dispo -- Family trying to get pt and wife into ALF    Lisette Abu, PA-C Pager: [phone] General Trauma PA Pager: [phone]  08/21/2015

## 2015-08-21 NOTE — NC FL2 (Signed)
[place] LEVEL OF CARE SCREENING TOOL     IDENTIFICATION  Patient Name: John [name] Birthdate: Feb 24, 1928 Sex: male Admission Date (Current Location): 08/18/2015  [facility] and [place] Number:  [employer] and Address:  [facility] [place] [place] [address], [place], [place] [address]      Provider Number: [id number]  Attending Physician Name and Address:  Trauma Md, MD  Relative Name and Phone Number:       Current Level of Care: Hospital Recommended Level of Care: [facility] Prior Approval Number:    Date Approved/Denied:   PASRR Number: [id number]  Discharge Plan: SNF    Current Diagnoses: Patient Active Problem List   Diagnosis Date Noted  . Right clavicle fracture 08/20/2015  . Traumatic subdural hematoma ([place]) 08/18/2015  . Fall 08/18/2015  . Orthostatic hypotension 02/18/2011  . Dizziness 02/18/2011  . S/P excision of acoustic neuroma 02/18/2011  . Facial droop chronic right 02/18/2011  . Dehydration 02/18/2011  . Pulmonary nodule 02/18/2011    Orientation RESPIRATION BLADDER Height & Weight     Self, Time, Situation, Place  Normal Continent Weight: 140 lb (63.504 kg) Height:  5\' 11"  (180.3 cm)  BEHAVIORAL SYMPTOMS/MOOD NEUROLOGICAL BOWEL NUTRITION STATUS   (NONE )  (NONE ) Continent Diet (Regular )  AMBULATORY STATUS COMMUNICATION OF NEEDS Skin   Limited Assist Verbally  (Posterior Head Laceration )                       Personal Care Assistance Level of Assistance  Bathing, Feeding, Dressing Bathing Assistance: Limited assistance Feeding assistance: Independent Dressing Assistance: Limited assistance     Functional Limitations Info  Speech, Hearing, Sight Sight Info: Adequate Hearing Info: Adequate Speech Info: Adequate    SPECIAL CARE FACTORS FREQUENCY  PT (By licensed PT), OT (By licensed OT)     PT Frequency: 3              Contractures      Additional Factors Info   Code Status, Allergies Code Status Info: FULL CODE  Allergies Info: Fish Allergy, Peanuts           Current Medications (08/21/2015):  This is the current hospital active medication list Current Facility-Administered Medications  Medication Dose Route Frequency Provider Last Rate Last Dose  . acetaminophen (TYLENOL) tablet 650 mg  650 mg Oral Q4H PRN Georganna Skeans, MD   650 mg at 08/19/15 1646  . aspirin tablet 325 mg  325 mg Oral Daily PRN Lisette Abu, PA-C      . Chlorhexidine Gluconate Cloth 2 % PADS 6 each  6 each Topical Q0600 Kary Kos, MD   6 each at 08/20/15 [phone]  . mupirocin ointment (BACTROBAN) 2 % 1 application  1 application Nasal BID Kary Kos, MD   1 application at [id number]  . ondansetron (ZOFRAN) tablet 4 mg  4 mg Oral Q6H PRN Georganna Skeans, MD       Or  . ondansetron [facility]) injection 4 mg  4 mg Intravenous Q6H PRN Georganna Skeans, MD      . sodium chloride flush (NS) 0.9 % injection 3 mL  3 mL Intravenous PRN Judeth Horn, MD      . traMADol Veat[facility]) tablet 50 mg  50 mg Oral Q6H PRN Georganna Skeans, MD   50 mg at 08/19/15 0225     Discharge Medications: Please see discharge summary for a list of discharge medications.  Relevant Imaging Results:  Relevant Lab Results:   Additional Information SSN [id number]  Glendon Axe, MSW, LCSWA [phone] 08/21/2015 2:13 PM

## 2015-08-21 NOTE — Progress Notes (Signed)
Pt discharged from hospital per orders from MD. Pt and family educated on discharge instructions. Pt and family verbalized understanding of instructions. All questions and concerns were addressed. Pt's IV's were removed before discharge. Pt exited hospital via wheelchair.

## 2015-08-21 NOTE — Clinical Social Work Note (Signed)
Clinical Social Worker met with patient and family (spouse and son) present at bedside in reference to post-acute placement for SNF. CSW reviewed and provided SNF list. Pt's son reported that patient and family are agreeable to SNF search however would like to consider the option of returning home. FL-2 completed and faxed to [facility] area.   Pt's son stated that he is currently arranging private care givers for patient and mother to have 24 hrs per day. Family members have also agreed to care for patient and his spouse.   Patient stated he would be more comfortable returning home with private care givers and home health care. Pt's wife is also agreeable and prefers that patient returns home.   Pt's son and family requesting to meet with RNCM in regards to home health. CSW has notified RNCM.   Pt and family have agreed that patient will return home at d/c. Home health care arranged. SNF placement no longer required at this time. No further concerns reported at this time.  Clinical Social Worker will sign off for now as social work intervention is no longer needed. Please consult [place] again if new need arises.  Glendon Axe, MSW, LCSWA [phone] 08/21/2015 5:19 PM

## 2015-08-21 NOTE — Discharge Summary (Signed)
Physician Discharge Summary  Patient ID: John [name] MRN: [id number] DOB/AGE: [birth year] 82 y.o.  Admit date: 08/18/2015 Discharge date: 08/21/2015  Discharge Diagnoses Patient Active Problem List   Diagnosis Date Noted  . Right clavicle fracture 08/20/2015  . Traumatic subdural hematoma ([place]) 08/18/2015  . Fall 08/18/2015  . Orthostatic hypotension 02/18/2011  . Dizziness 02/18/2011  . S/P excision of acoustic neuroma 02/18/2011  . Facial droop chronic right 02/18/2011  . Dehydration 02/18/2011  . Pulmonary nodule 02/18/2011    Consultants Dr. Kary Kos for neurosurgery  Dr. 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 The door apparently struck him in the foot and he fell. He was amnestic to most of the event. There was uncertain loss of consciousness. He was taken to the [facility] emergency department for further evaluation. He was found to have a traumatic brain injury with a small left subdural hematoma and small subarachnoid hemorrhage. Additionally, he had a right clavicle fracture. He was accepted in transfer for admission to the trauma service. Neurosurgery and orthopedic surgery were consulted.   Hospital Course: Orthopedic surgery recommended non-operative treatment of his clavicle fracture in a sling. Neurosurgery also recommended non-operative treatment of his brain injury with a follow-up head CT the following day. This was stable. He was evaluated by physical and occupational therapies who recommended skilled nursing facility placement. The family conferred and decided to hire private help instead so the patient could be home. He was discharged there in good condition.     Medication List    TAKE these medications        aspirin 325 MG tablet  Take 325 mg by mouth daily.            Follow-up Information    Follow up with Mauri Pole, MD In 3 weeks.   Specialty:  Orthopedic Surgery   Why:  for X-ray follow up of right clavicle injury   Contact information:   [address] [place] [address] [phone]       Call [facility].   Why:  As needed   Contact information:   [address] [id number] [place]      Schedule an appointment as soon as possible for a visit with Elaina Hoops, MD.   Specialty:  Neurosurgery   Contact information:   [address] [place] [address] [place] [address] [phone]        Signed: Lisette Abu, PA-C Pager: [phone] General Trauma [facility] Pager: [phone] 08/21/2015, 3:49 PM

## 2015-08-21 NOTE — Discharge Instructions (Signed)
Maintain sling for comfort for 3 weeks.  Arm use in sling as tolerated Limited to no weight bearing until follow X-rays and discussion  Walk with assistance.

## 2015-08-25 LAB — GLUCOSE, CAPILLARY: Glucose-Capillary: 89 mg/dL (ref 65–99)

## 2016-11-08 IMAGING — DX DG SHOULDER 2+V*R*
2 series · 2 of 2 positions shown · non-contrast
Comparison: None.

CLINICAL DATA: Syncope.  Fall.  Bruising on top of right shoulder.

EXAM:
RIGHT SHOULDER - 2+ VIEW

[shoulder ap (1 of 2)]
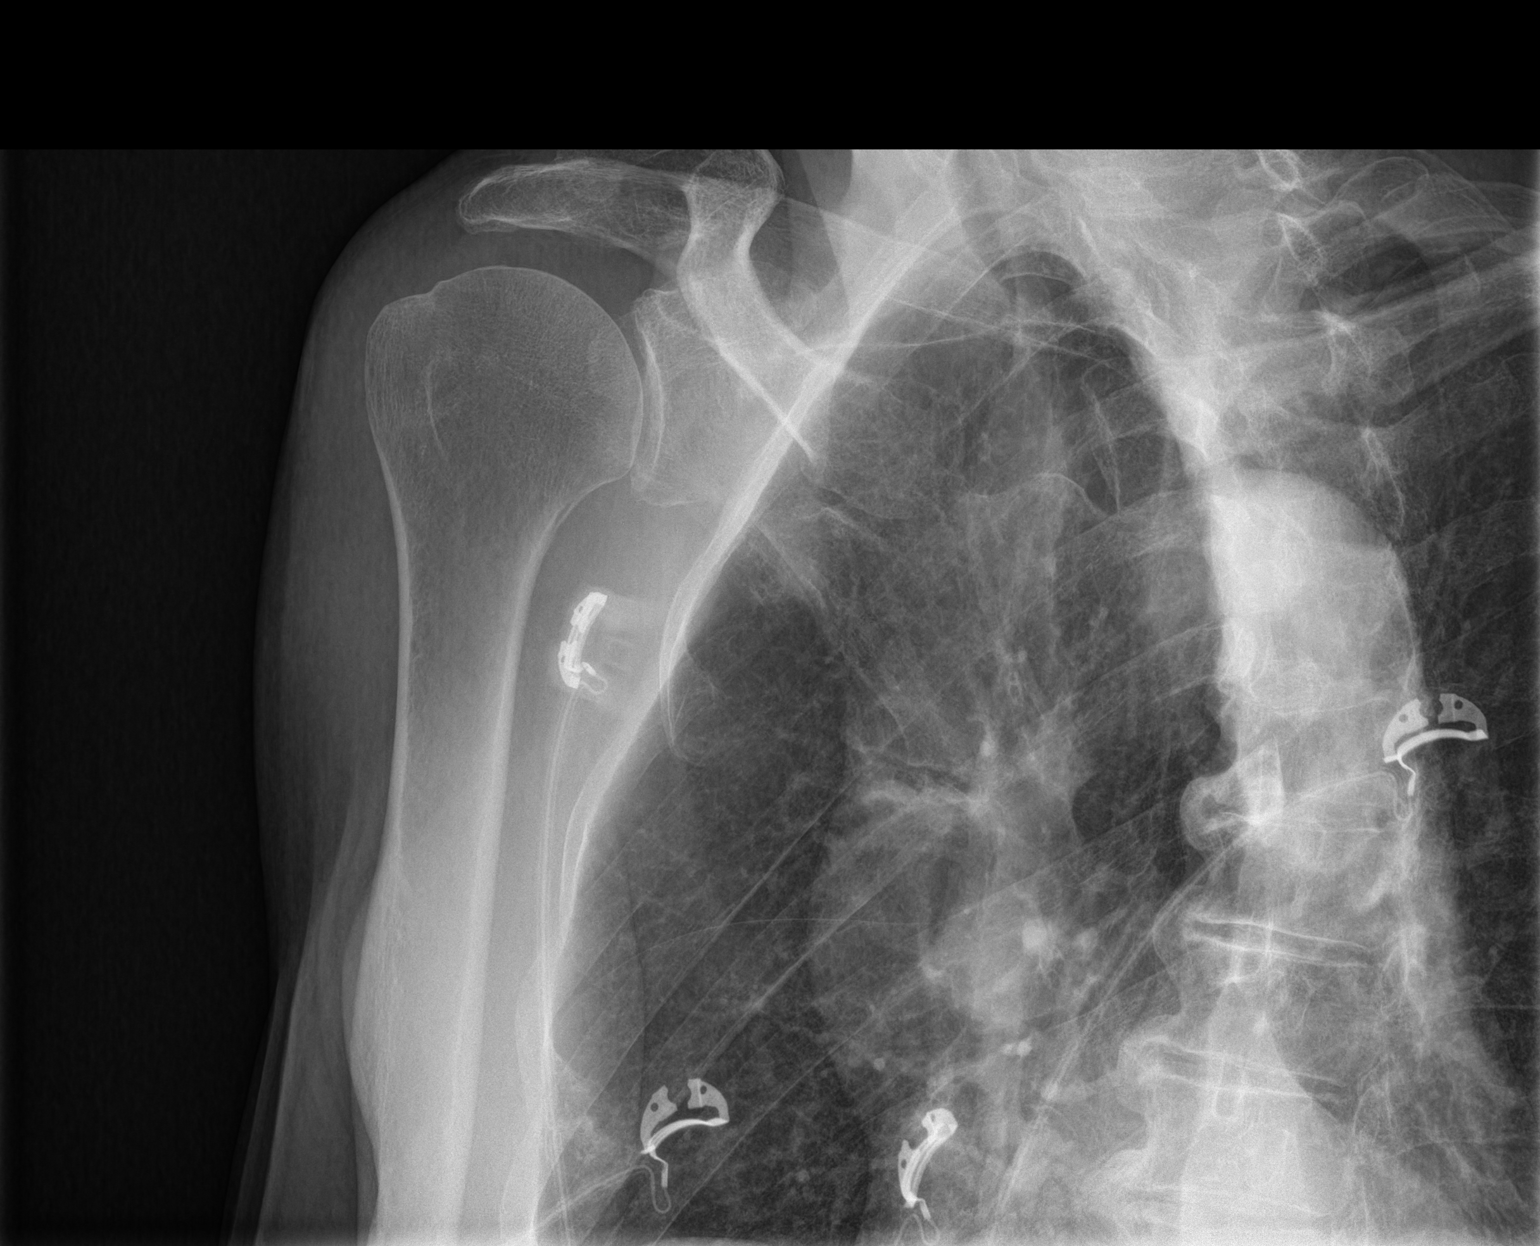

[shoulder ap (2 of 2)]
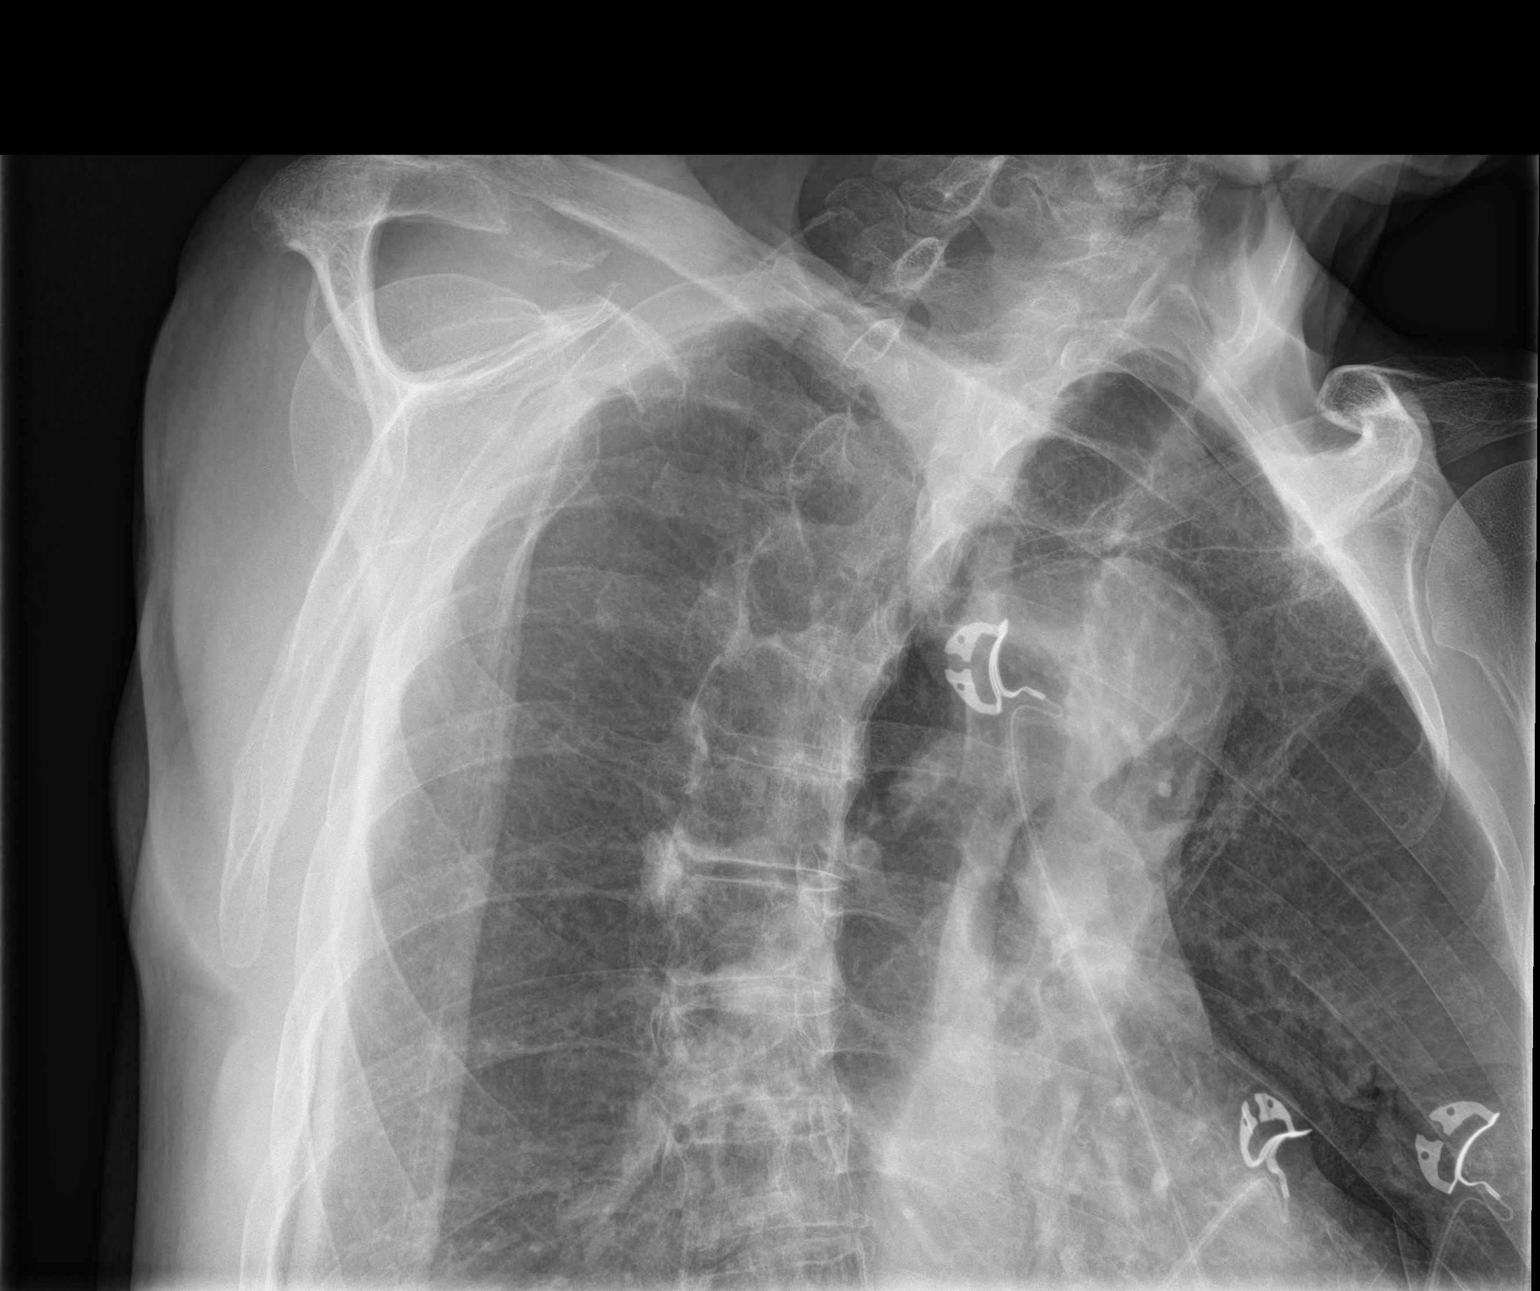

[2 of 2 positions shown; findings below may reference images not displayed]

FINDINGS: There is a fracture through the distal right clavicle. Glenohumeral
joint appears intact. No humeral abnormality.
IMPRESSION: Fracture through the distal right clavicle.

## 2020-08-13 DEATH — deceased
